# Patient Record
Sex: Female | Born: 1965 | Race: Black or African American | Hispanic: No | Marital: Married | Smoking: Never smoker
Health system: Southern US, Community
[De-identification: ages and names within clinical notes are randomized; demographics above are authoritative.]

## PROBLEM LIST (undated history)

## (undated) DIAGNOSIS — G4730 Sleep apnea, unspecified: Secondary | ICD-10-CM

## (undated) DIAGNOSIS — I10 Essential (primary) hypertension: Secondary | ICD-10-CM

## (undated) DIAGNOSIS — E785 Hyperlipidemia, unspecified: Secondary | ICD-10-CM

## (undated) DIAGNOSIS — R7303 Prediabetes: Secondary | ICD-10-CM

## (undated) DIAGNOSIS — K219 Gastro-esophageal reflux disease without esophagitis: Secondary | ICD-10-CM

## (undated) DIAGNOSIS — Z803 Family history of malignant neoplasm of breast: Secondary | ICD-10-CM

## (undated) HISTORY — DX: Family history of malignant neoplasm of breast: Z80.3

---

## 2006-12-13 ENCOUNTER — Emergency Department: Payer: Self-pay | Admitting: Emergency Medicine

## 2006-12-31 ENCOUNTER — Ambulatory Visit: Payer: Self-pay | Admitting: Surgery

## 2007-01-09 ENCOUNTER — Ambulatory Visit: Payer: Self-pay | Admitting: Surgery

## 2007-03-05 ENCOUNTER — Ambulatory Visit: Payer: Self-pay | Admitting: Family Medicine

## 2008-05-27 ENCOUNTER — Ambulatory Visit: Payer: Self-pay | Admitting: Family Medicine

## 2011-07-24 ENCOUNTER — Ambulatory Visit: Payer: Self-pay | Admitting: Family Medicine

## 2011-07-25 ENCOUNTER — Ambulatory Visit: Payer: Self-pay

## 2012-08-15 ENCOUNTER — Ambulatory Visit: Payer: Self-pay

## 2012-09-01 ENCOUNTER — Ambulatory Visit: Payer: Self-pay

## 2012-10-02 ENCOUNTER — Ambulatory Visit: Payer: Self-pay

## 2012-10-02 DIAGNOSIS — G4730 Sleep apnea, unspecified: Secondary | ICD-10-CM

## 2012-10-02 HISTORY — DX: Sleep apnea, unspecified: G47.30

## 2013-09-02 HISTORY — PX: CERVICAL POLYPECTOMY: SHX88

## 2013-09-07 ENCOUNTER — Ambulatory Visit: Payer: Self-pay | Admitting: Family Medicine

## 2013-09-13 ENCOUNTER — Ambulatory Visit: Payer: Self-pay | Admitting: Family Medicine

## 2013-09-27 ENCOUNTER — Ambulatory Visit: Payer: Self-pay | Admitting: Obstetrics and Gynecology

## 2013-09-27 LAB — CBC
HCT: 37.8 % (ref 35.0–47.0)
HGB: 11.6 g/dL — ABNORMAL LOW (ref 12.0–16.0)
MCH: 21.6 pg — ABNORMAL LOW (ref 26.0–34.0)
MCHC: 30.8 g/dL — ABNORMAL LOW (ref 32.0–36.0)
MCV: 70 fL — ABNORMAL LOW (ref 80–100)
PLATELETS: 226 10*3/uL (ref 150–440)
RBC: 5.4 10*6/uL — AB (ref 3.80–5.20)
RDW: 25.6 % — ABNORMAL HIGH (ref 11.5–14.5)
WBC: 4.5 10*3/uL (ref 3.6–11.0)

## 2013-09-27 LAB — BASIC METABOLIC PANEL
ANION GAP: 4 — AB (ref 7–16)
BUN: 17 mg/dL (ref 7–18)
CALCIUM: 9.7 mg/dL (ref 8.5–10.1)
CO2: 32 mmol/L (ref 21–32)
Chloride: 99 mmol/L (ref 98–107)
Creatinine: 0.83 mg/dL (ref 0.60–1.30)
EGFR (Non-African Amer.): >60
Glucose: 75 mg/dL (ref 65–99)
OSMOLALITY: 270 (ref 275–301)
POTASSIUM: 3 mmol/L — AB (ref 3.5–5.1)
Sodium: 135 mmol/L — ABNORMAL LOW (ref 136–145)

## 2013-10-07 ENCOUNTER — Ambulatory Visit: Payer: Self-pay | Admitting: Obstetrics and Gynecology

## 2013-10-09 LAB — PATHOLOGY REPORT

## 2014-04-04 ENCOUNTER — Ambulatory Visit: Payer: Self-pay | Admitting: Family Medicine

## 2014-10-05 ENCOUNTER — Ambulatory Visit: Payer: Self-pay | Admitting: Family Medicine

## 2014-12-24 NOTE — Op Note (Signed)
PATIENT NAME:  Jennifer Mueller, Jennifer Mueller MR#:  [id number] DATE OF BIRTH:  June 28, 1966  DATE OF PROCEDURE:  10/07/2013  PREOPERATIVE DIAGNOSIS:  Menorrhagia and endometrial polyp.   POSTOPERATIVE DIAGNOSIS:  Menorrhagia and endometrial polyp  PROCEDURES PERFORMED:  Hysteroscopy and dilatation and curettage.   ANESTHESIA USED:  General.   PRIMARY SURGEON:  Florina OuAndreas M. Bonney AidStaebler, M.D.   ESTIMATED BLOOD LOSS:  Minimal.  OPERATIVE FLUIDS:  600 mL of crystalloid.   URINE OUTPUT:  50 mL of clear urine.   PREOPERATIVE ANTIBIOTICS:  None.   DRAINS OR TUBES:  None.   IMPLANTS:  None.   COMPLICATIONS:  None.   FINDINGS:  Several small endometrial polyps. Normal cavity contour, normal tubal ostia and cervix.   SPECIMENS REMOVED:  Endometrial curettings.   THE PATIENT CONDITION FOLLOWING PROCEDURE:  Stable.   PROCEDURE IN DETAIL:  The risks, benefits and alternatives of the procedure were discussed with the patient prior to proceeding to the Operating Room. The patient was taken to the Operating Room where she was placed under general endotracheal anesthesia. She was positioned in the dorsal lithotomy position using Allen stirrups, prepped and draped in the usual sterile fashion. A time-out was performed. Attention was then turned to the patient's pelvis. The bladder drained using a red rubber catheter and an operative speculum was then placed to visualize the cervix. The anterior lip of the cervix was grasped with a single-tooth tenaculum and the cervix sequentially dilated using Pratt dilators. Following dilation of the cervix, hysteroscopy was performed noting the above findings. Following hysteroscopy, a sharp curettage was performed with a moderate amount of tissue returned with the curettage. A second look hysteroscopy revealed the previously documented polyp. The single-tooth tenaculum was removed and the tenaculum sites were noted to be hemostatic as was the cervix. The sponge, needle and instrument  counts were correct x 2. The patient tolerated the procedure well and was taken to the Recovery Room in stable condition.   ____________________________ Florina OuAndreas M. Bonney AidStaebler, MD ams:jm D: 10/07/2013 16:03:48 ET T: 10/07/2013 17:11:45 ET JOB#: [id number]  cc: Florina OuAndreas M. Bonney AidStaebler, MD, <Dictator> Carmel SacramentoANDREAS Cathrine MusterM Teleshia Lemere MD ELECTRONICALLY SIGNED 10/20/2013 0:59

## 2015-06-30 ENCOUNTER — Encounter: Payer: Self-pay | Admitting: Emergency Medicine

## 2015-06-30 ENCOUNTER — Emergency Department
Admission: EM | Admit: 2015-06-30 | Discharge: 2015-06-30 | Disposition: A | Discharge status: Home / Self Care | Payer: BC Managed Care – PPO | Attending: Emergency Medicine | Admitting: Emergency Medicine

## 2015-06-30 ENCOUNTER — Emergency Department: Payer: BC Managed Care – PPO

## 2015-06-30 DIAGNOSIS — H109 Unspecified conjunctivitis: Secondary | ICD-10-CM | POA: Insufficient documentation

## 2015-06-30 DIAGNOSIS — H5711 Ocular pain, right eye: Secondary | ICD-10-CM | POA: Diagnosis present

## 2015-06-30 DIAGNOSIS — L03213 Periorbital cellulitis: Secondary | ICD-10-CM

## 2015-06-30 DIAGNOSIS — H05011 Cellulitis of right orbit: Secondary | ICD-10-CM | POA: Diagnosis not present

## 2015-06-30 DIAGNOSIS — L989 Disorder of the skin and subcutaneous tissue, unspecified: Secondary | ICD-10-CM | POA: Insufficient documentation

## 2015-06-30 DIAGNOSIS — I10 Essential (primary) hypertension: Secondary | ICD-10-CM | POA: Insufficient documentation

## 2015-06-30 HISTORY — DX: Essential (primary) hypertension: I10

## 2015-06-30 LAB — CBC WITH DIFFERENTIAL/PLATELET
BASOS PCT: 0 %
Basophils Absolute: 0 10*3/uL (ref 0–0.1)
EOS ABS: 0.1 10*3/uL (ref 0–0.7)
EOS PCT: 1 %
HCT: 45.4 % (ref 35.0–47.0)
Hemoglobin: 15.2 g/dL (ref 12.0–16.0)
LYMPHS ABS: 1.2 10*3/uL (ref 1.0–3.6)
Lymphocytes Relative: 21 %
MCH: 26.2 pg (ref 26.0–34.0)
MCHC: 33.5 g/dL (ref 32.0–36.0)
MCV: 78.4 fL — ABNORMAL LOW (ref 80.0–100.0)
MONO ABS: 0.7 10*3/uL (ref 0.2–0.9)
MONOS PCT: 11 %
Neutro Abs: 4 10*3/uL (ref 1.4–6.5)
Neutrophils Relative %: 67 %
PLATELETS: 206 10*3/uL (ref 150–440)
RBC: 5.79 MIL/uL — ABNORMAL HIGH (ref 3.80–5.20)
RDW: 14.6 % — AB (ref 11.5–14.5)
WBC: 6 10*3/uL (ref 3.6–11.0)

## 2015-06-30 LAB — BASIC METABOLIC PANEL
Anion gap: 8 (ref 5–15)
BUN: 11 mg/dL (ref 6–20)
CALCIUM: 9.2 mg/dL (ref 8.9–10.3)
CHLORIDE: 99 mmol/L — AB (ref 101–111)
CO2: 27 mmol/L (ref 22–32)
CREATININE: 0.79 mg/dL (ref 0.44–1.00)
GFR calc Af Amer: >60 mL/min (ref >60)
GFR calc non Af Amer: >60 mL/min (ref >60)
Glucose, Bld: 111 mg/dL — ABNORMAL HIGH (ref 65–99)
Potassium: 3.6 mmol/L (ref 3.5–5.1)
SODIUM: 134 mmol/L — AB (ref 135–145)

## 2015-06-30 LAB — SEDIMENTATION RATE: SED RATE: 9 mm/h (ref 0–20)

## 2015-06-30 MED ORDER — HYDROMORPHONE HCL 1 MG/ML IJ SOLN
1.0000 mg | Freq: Once | INTRAMUSCULAR | Status: AC
  Administered 2015-06-30: 1 mg via INTRAVENOUS
  Filled 2015-06-30: qty 1

## 2015-06-30 MED ORDER — VALACYCLOVIR HCL 500 MG PO TABS: 1000.0000 mg | ORAL_TABLET | Freq: Three times a day (TID) | ORAL | Status: AC

## 2015-06-30 MED ORDER — IOHEXOL 300 MG/ML  SOLN
75.0000 mL | Freq: Once | INTRAMUSCULAR | Status: AC | PRN
Start: 2015-06-30 — End: 2015-06-30
  Administered 2015-06-30: 75 mL via INTRAVENOUS

## 2015-06-30 MED ORDER — FLUORESCEIN SODIUM 1 MG OP STRP
1.0000 | ORAL_STRIP | Freq: Once | OPHTHALMIC | Status: AC
  Administered 2015-06-30: 1 via OPHTHALMIC
  Filled 2015-06-30: qty 1

## 2015-06-30 MED ORDER — TETRACAINE HCL 0.5 % OP SOLN
2.0000 [drp] | Freq: Once | OPHTHALMIC | Status: AC
  Administered 2015-06-30: 2 [drp] via OPHTHALMIC
  Filled 2015-06-30: qty 2

## 2015-06-30 MED ORDER — SODIUM CHLORIDE 0.9 % IV BOLUS (SEPSIS)
1000.0000 mL | Freq: Once | INTRAVENOUS | Status: AC
  Administered 2015-06-30: 1000 mL via INTRAVENOUS
  Filled 2015-06-30: qty 1000

## 2015-06-30 MED ORDER — CLINDAMYCIN HCL 300 MG PO CAPS: 300.0000 mg | ORAL_CAPSULE | Freq: Three times a day (TID) | ORAL | Status: DC

## 2015-06-30 NOTE — ED Provider Notes (Signed)
[facility] Emergency Department Provider Note  ____________________________________________  Time seen: 6:10 AM  I have reviewed the triage vital signs and the nursing notes.   HISTORY  Chief Complaint Conjunctivitis    HPI Blossom Hoopsngela W Brazie is a 49 y.o. female who complains of right eye redness swelling and pain is been worsening over the past 4 days. She initially saw her doctor who thought that it was pinkeye and so recommended conservative treatment and did not start antibiotics. The symptoms continued and she developed a rash just to the right of the midline of the forehead and nose yesterday, so she saw her optometrist yesterday who prescribed topical ophthalmic antibiotics of erythromycin and tobramycin.Today she woke up with again worsening pain. No vision changes.     History reviewed. No pertinent past medical history. Hypertension  There are no active problems to display for this patient.    History reviewed. No pertinent past surgical history. Noncontributory  No current outpatient prescriptions on file.   Allergies Review of patient's allergies indicates no known allergies.   History reviewed. No pertinent family history.  Social History Social History  Substance Use Topics  . Smoking status: Never Smoker   . Smokeless tobacco: None  . Alcohol Use: No    Review of Systems  Constitutional:   No fever or chills. No weight changes Eyes:   No blurry vision or double vision. Right eye pain and redness and swelling as above ENT:   No sore throat. Cardiovascular:   No chest pain. Respiratory:   No dyspnea or cough. Gastrointestinal:   Negative for abdominal pain, vomiting and diarrhea.  No BRBPR or melena. Genitourinary:   Negative for dysuria, urinary retention, bloody urine, or difficulty urinating. Musculoskeletal:   Negative for back pain. No joint swelling or pain. Skin:   Negative for rash. Neurological:   Negative for  headaches, focal weakness or numbness. Psychiatric:  No anxiety or depression.   Endocrine:  No hot/cold intolerance, changes in energy, or sleep difficulty.  10-point ROS otherwise negative.  ____________________________________________   PHYSICAL EXAM:  VITAL SIGNS: ED Triage Vitals  Enc Vitals Group     BP 06/30/15 0554 183/114 mmHg     Pulse Rate 06/30/15 0554 92     Resp 06/30/15 0554 18     Temp 06/30/15 0554 98.2 F (36.8 C)     Temp Source 06/30/15 0554 Oral     SpO2 06/30/15 0554 97 %     Weight 06/30/15 0554 258 lb (117.028 kg)     Height 06/30/15 0554 5\' 5"  (1.651 m)     Head Cir --      Peak Flow --      Pain Score 06/30/15 0557 10     Pain Loc --      Pain Edu? --      Excl. in GC? --      Constitutional:   Alert and oriented. Well appearing and in no distress. Eyes:   No scleral icterus. No conjunctival pallor. PERRL. EOMI, but painful in the right eye with extraocular movements. Right eye conjunctivitis. ENT   Head:   Normocephalic and atraumatic. TMs and external canals normal. 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 There is one dendritic appearing ulceration in the skin of the right nasal bridge that is superficial. The eruptions are not open or weeping but do appear slightly  vesicular.   Nose:   No congestion/rhinnorhea. No septal hematoma   Mouth/Throat:   MMM, no pharyngeal erythema. No peritonsillar mass. No uvula shift.   Neck:   No stridor. No SubQ emphysema. No meningismus. Hematological/Lymphatic/Immunilogical:   No cervical lymphadenopathy. Cardiovascular:   RRR. Normal and symmetric distal pulses are present in all extremities. No murmurs, rubs, or gallops. Respiratory:   Normal respiratory effort without tachypnea nor retractions. Breath sounds are clear and equal bilaterally. No wheezes/rales/rhonchi. Gastrointestinal:   Soft and  nontender. No distention. There is no CVA tenderness.  No rebound, rigidity, or guarding. Genitourinary:   deferred Musculoskeletal:   Nontender with normal range of motion in all extremities. No joint effusions.  No lower extremity tenderness.  No edema. Neurologic:   Normal speech and language.  CN 2-10 normal. Motor grossly intact. No pronator drift.  Normal gait. No gross focal neurologic deficits are appreciated.  Skin:    Skin is warm, dry and intact. No rash noted.  No petechiae, purpura, or bullae. Psychiatric:   Mood and affect are normal. Speech and behavior are normal. Patient exhibits appropriate insight and judgment.  ____________________________________________    LABS (pertinent positives/negatives) (all labs ordered are listed, but only abnormal results are displayed) Labs Reviewed  BASIC METABOLIC PANEL  CBC WITH DIFFERENTIAL/PLATELET  SEDIMENTATION RATE   ____________________________________________   EKG    ____________________________________________    RADIOLOGY  CT orbits pending  ____________________________________________   PROCEDURES Slit lamp exam Examined with and without fluorescein stain with gaze directed in all directions No corneal ulcerations or abrasions noted.  No cell/flare.  Clear ant. Chamber.  Unremarkable OD exam.    ____________________________________________   INITIAL IMPRESSION / ASSESSMENT AND PLAN / ED COURSE  Pertinent labs & imaging results that were available during my care of the patient were reviewed by me and considered in my medical decision making (see chart for details).  Patient presents with worsening right eye symptoms and a facial rash. Main concerns are shingles with ophthalmic involvement versus orbital cellulitis. At the very least this is likely to be a periorbital cellulitis that requires oral antibiotics. We'll check labs and CT orbits, slit lamp  exam.  ----------------------------------------- 7:17 AM on 06/30/2015 -----------------------------------------  Slit lamp exam unremarkable, symptoms much improved after Dilaudid and topical tetracaine. Patient is artery on topical antibiotic steroid drops. His CT unremarkable we will treat preseptal cellulitis as well as acyclovir for possible zoster ophthalmicus and have her follow up with ophthalmology. The patient is signed out to oncoming physician Dr. Derrill Kay.   ____________________________________________   FINAL CLINICAL IMPRESSION(S) / ED DIAGNOSES  Final diagnoses:  None   right eye pain    Sharman Cheek, MD 06/30/15 [phone]

## 2015-06-30 NOTE — Discharge Instructions (Signed)
Please seek medical attention for any worsening eye pain, change in vision, inability to move your eye, high fevers, chest pain, shortness of breath, change in behavior, persistent vomiting, bloody stool or any other new or concerning symptoms.  Preseptal Cellulitis, Adult Preseptal cellulitis--also called periorbital cellulitis--is an infection that can affect your eyelid and the soft tissues or skin that surround your eye. The infection may also affect the structures that produce and drain your tears. It does not affect your eye itself. CAUSES This condition may be caused by:  Bacterial infection.  Long-term (chronic) sinus infections.  An object (foreign body) that is stuck behind the eye.  An injury that:  Goes through the eyelid tissues.  Causes an infection, such as an insect sting.  Fracture of the bone around the eye.  Infections that have spread from the eyelid or other structures around the eye.  Bite wounds.  Inflammation or infection of the lining membranes of the brain (meningitis).  An infection in the blood (septicemia).  Dental infection (abscess).  Viral infection. This is rare. RISK FACTORS Risk factors for preseptal cellulitis include:  Participating in activities that increase your risk of trauma to the face or head, such as boxing or high-speed activities.  Having a weakened defense system (immune system).  Medical conditions, such as nasal polyps, that increase your risk for frequent or recurrent sinus infections.  Not receiving regular dental care. SYMPTOMS Symptoms of this condition usually come on suddenly. Symptoms may include:  Red, hot, and swollen eyelids.  Fever.  Difficulty opening your eye.  Eye pain. DIAGNOSIS This condition may be diagnosed by an eye exam. You may also have tests, such as:  Blood tests.  CT scan.  MRI.  Spinal tap (lumbar puncture). This is a procedure that involves removing and examining a small amount of  the fluid that surrounds the brain and spinal cord. This checks for meningitis. TREATMENT Treatment for this condition will include antibiotic medicines. These may be given by mouth (orally), through an IV, or as a shot. Your health care provider may also recommend nasal decongestants to reduce swelling. HOME CARE INSTRUCTIONS  Take your antibiotic medicine as directed by your health care provider. Finish all of it even if you start to feel better.  Take medicines only as directed by your health care provider.  Drink enough fluid to keep your urine clear or pale yellow.  Do not use any tobacco products, including cigarettes, chewing tobacco, or electronic cigarettes. If you need help quitting, ask your health care provider.  Keep all follow-up visits as directed by your health care provider. These include any visits with an eye specialist (ophthalmologist) or dentist. SEEK MEDICAL CARE IF:  You have a fever.  Your eyelids become more red, warm, or swollen.  You have new symptoms.  Your symptoms do not get better with treatment. SEEK IMMEDIATE MEDICAL CARE IF:  You develop double vision, or your vision becomes blurred or worsens in any way.  You have trouble moving your eyes.  Your eye looks like it is sticking out or bulging out (proptosis).  You develop a severe headache, severe neck pain, or neck stiffness.  You develop repeated vomiting.   This information is not intended to replace advice given to you by your health care provider. Make sure you discuss any questions you have with your health care provider.   Document Released: 09/21/2010 Document Revised: 01/03/2015 Document Reviewed: 08/15/2014 Elsevier Interactive Patient Education [employer]2016 [employer] Inc.

## 2015-06-30 NOTE — ED Provider Notes (Signed)
-----------------------------------------   8:28 AM on 06/30/2015 -----------------------------------------  CT orbits back and consistent with preseptal cellulitis. Per Dr. Charmian MuffStafford's sign out will place on oral antibiotics and acyclovir. Will have patient follow up with ophthalmology.  Jennifer SemenGraydon Kiyanna Biegler, MD 06/30/15 [phone]

## 2015-06-30 NOTE — ED Notes (Signed)
Pt arrived to the ED for complaints of eye swelling and drainage x4 days. Pt is AOx4 in no apparent distress.

## 2015-07-14 ENCOUNTER — Other Ambulatory Visit: Payer: Self-pay | Admitting: Family Medicine

## 2015-07-14 DIAGNOSIS — Z1231 Encounter for screening mammogram for malignant neoplasm of breast: Secondary | ICD-10-CM

## 2015-10-10 ENCOUNTER — Ambulatory Visit
Admission: RE | Admit: 2015-10-10 | Discharge: 2015-10-10 | Disposition: A | Discharge status: Home / Self Care | Payer: BC Managed Care – PPO | Source: Ambulatory Visit | Attending: Family Medicine | Admitting: Family Medicine

## 2015-10-10 DIAGNOSIS — Z1231 Encounter for screening mammogram for malignant neoplasm of breast: Secondary | ICD-10-CM | POA: Insufficient documentation

## 2016-01-30 ENCOUNTER — Ambulatory Visit
Admission: EM | Admit: 2016-01-30 | Discharge: 2016-01-30 | Disposition: A | Discharge status: Home / Self Care | Payer: BC Managed Care – PPO | Attending: Internal Medicine | Admitting: Internal Medicine

## 2016-01-30 ENCOUNTER — Encounter: Payer: Self-pay | Admitting: Gynecology

## 2016-01-30 DIAGNOSIS — N9089 Other specified noninflammatory disorders of vulva and perineum: Secondary | ICD-10-CM

## 2016-01-30 HISTORY — DX: Hyperlipidemia, unspecified: E78.5

## 2016-01-30 HISTORY — DX: Gastro-esophageal reflux disease without esophagitis: K21.9

## 2016-01-30 MED ORDER — SULFAMETHOXAZOLE-TRIMETHOPRIM 800-160 MG PO TABS: 1.0000 | ORAL_TABLET | Freq: Two times a day (BID) | ORAL | Status: AC

## 2016-01-30 NOTE — Discharge Instructions (Signed)
Sore place could be a fever blister or a small boil.   Apply warm compresses for 5 minutes twice daily to promote drainage.  Apply antibiotic ointment after warm compresses, to keep surface of lesion soft and promote drainage. [facility] [place]   A swab was taken today for the cold sore virus; if this is positive we will let you know and send a prescription for a cold sore medicine. Recheck or followup with Dr Cornelius Moraswen, if not continuing to improve, after several days.

## 2016-01-30 NOTE — ED Notes (Signed)
Patient stated notice lump on vaginal region and itching x 6 days. Per patient no odor or discharge.

## 2016-02-01 LAB — HERPES SIMPLEX VIRUS(HSV) DNA BY PCR
HSV 1 DNA: NEGATIVE
HSV 2 DNA: NEGATIVE

## 2016-02-02 NOTE — ED Provider Notes (Signed)
CSN: [id number]     Arrival date & time 01/30/16  1613 History   First MD Initiated Contact with Patient 01/30/16 1739     Chief Complaint  Patient presents with  . Vaginal Itching   HPI  50 yo lady with 4-5d hx itchy/uncomfortable small lump R labial minora.  ?Draining.  Not enlarging, seems to be getting a little smaller.  No unusual vaginal discharge/bleeding, no dysuria.  Usually constipated, stools a little softer this week.  No abd pain.  No fever, no malaise.    Past Medical History  Diagnosis Date  . Hypertension   . Hyperlipemia   . GERD (gastroesophageal reflux disease)    Past Surgical History  Procedure Laterality Date  . Cervical polypectomy     Family History  Problem Relation Age of Onset  . Breast cancer Sister 2440  . Breast cancer Maternal Aunt 1058   Social History  Substance Use Topics  . Smoking status: Never Smoker   . Smokeless tobacco: None  . Alcohol Use: No    Review of Systems  All other systems reviewed and are negative.   Allergies  Review of patient's allergies indicates no known allergies.  Home Medications   Prior to Admission medications   Medication Sig Start Date End Date Taking? Authorizing Provider  amLODipine (NORVASC) 5 MG tablet Take 5 mg by mouth daily.   Yes Historical Provider, MD  Atorvastatin Calcium (LIPITOR PO) Take by mouth.   Yes Historical Provider, MD  levonorgestrel (MIRENA) 20 MCG/24HR IUD 1 each by Intrauterine route once.   Yes Historical Provider, MD  lisinopril-hydrochlorothiazide (PRINZIDE,ZESTORETIC) 20-12.5 MG tablet Take 1 tablet by mouth daily.   Yes Historical Provider, MD  metoprolol (LOPRESSOR) 50 MG tablet Take 100 mg by mouth every morning.   Yes Historical Provider, MD  potassium chloride (K-DUR) 10 MEQ tablet Take 10 mEq by mouth daily.   Yes Historical Provider, MD  clindamycin (CLEOCIN) 300 MG capsule Take 1 capsule (300 mg total) by mouth 3 (three) times daily. 06/30/15   Phineas SemenGraydon Goodman, MD   erythromycin ophthalmic ointment Place 1 application into the right eye at bedtime. For 10 days 06/29/15   Historical Provider, MD  naproxen sodium (ANAPROX) 220 MG tablet Take 440 mg by mouth 2 (two) times daily as needed (for pain).    Historical Provider, MD  tobramycin-dexamethasone [facility](TOBRADEX) ophthalmic solution Place 1 drop into the right eye 4 (four) times daily. For 7 days 06/29/15   Historical Provider, MD     BP 148/94 mmHg  Pulse 66  Temp(Src) 98.1 F (36.7 C) (Oral)  Resp 18  Ht 5\' 5"  (1.651 m)  Wt 265 lb (120.203 kg)  BMI 44.10 kg/m2  SpO2 99%  LMP 01/17/2016 Physical Exam  Constitutional: She is oriented to person, place, and time. No distress.  Alert, nicely groomed  HENT:  Head: Atraumatic.  Eyes:  Conjugate gaze, no eye redness/drainage  Neck: Neck supple.  Cardiovascular: Normal rate.   Pulmonary/Chest: No respiratory distress.  Abdominal: She exhibits no distension.  Genitourinary:  1cm pustular lesion pointing in 2 places, minimally tender, not deep, scant discharge expressible from site.  Otherwise vulva/perineum unremarkable, no other lesions.    Musculoskeletal: Normal range of motion.  No leg swelling  Neurological: She is alert and oriented to person, place, and time.  Skin: Skin is warm and dry.  No cyanosis  Nursing note and vitals reviewed.   ED Course  Procedures (including critical care time)  Labs Review  Labs Reviewed  HERPES SIMPLEX VIRUS(HSV) DNA BY PCR:  Pending (swab of lesion)     MDM   1. Labial lesion    Tiny abscess, spontaneously draining, staph/mixed organisms v HSV. Warm compresses, rx bactrim.  HSV swab pending. Recheck or [facility] if not continuing to improve over the next several days.    Meds ordered this encounter  Medications  . sulfamethoxazole-trimethoprim (BACTRIM DS,SEPTRA DS) 800-160 MG tablet    Sig: Take 1 tablet by mouth 2 (two) times daily.    Dispense:  14  tablet    Refill:  0     Eustace Moore, MD 02/02/16 2005

## 2016-02-04 ENCOUNTER — Telehealth: Payer: Self-pay | Admitting: *Deleted

## 2016-02-04 NOTE — ED Notes (Signed)
Patient returned phone call and was informed that her herpes lab results came back negative. Patient confirmed understanding of information.

## 2016-06-09 ENCOUNTER — Encounter: Payer: Self-pay | Admitting: Gynecology

## 2016-06-09 ENCOUNTER — Ambulatory Visit
Admission: EM | Admit: 2016-06-09 | Discharge: 2016-06-09 | Disposition: A | Discharge status: Home / Self Care | Payer: BC Managed Care – PPO | Attending: Family Medicine | Admitting: Family Medicine

## 2016-06-09 DIAGNOSIS — J019 Acute sinusitis, unspecified: Secondary | ICD-10-CM | POA: Diagnosis not present

## 2016-06-09 DIAGNOSIS — J069 Acute upper respiratory infection, unspecified: Secondary | ICD-10-CM | POA: Diagnosis not present

## 2016-06-09 MED ORDER — BENZONATATE 200 MG PO CAPS: 200.0000 mg | ORAL_CAPSULE | Freq: Three times a day (TID) | ORAL | 0 refills | Status: DC | PRN

## 2016-06-09 MED ORDER — FEXOFENADINE-PSEUDOEPHED ER 180-240 MG PO TB24: 1.0000 | ORAL_TABLET | Freq: Every day | ORAL | 0 refills | Status: DC

## 2016-06-09 MED ORDER — FLUTICASONE PROPIONATE 50 MCG/ACT NA SUSP: 2.0000 | Freq: Every day | NASAL | 0 refills | Status: DC

## 2016-06-09 MED ORDER — AMOXICILLIN-POT CLAVULANATE 875-125 MG PO TABS: 1.0000 | ORAL_TABLET | Freq: Two times a day (BID) | ORAL | 0 refills | Status: DC

## 2016-06-09 NOTE — ED Triage Notes (Addendum)
Patient c/o upper respiratory infection x 1 week.

## 2016-06-09 NOTE — ED Provider Notes (Signed)
[facility]    CSN: [id number] Arrival date & time: 06/09/16  1158     History   Chief Complaint Chief Complaint  Patient presents with  . URI    HPI Jennifer Mueller is a 50 y.o. female.   Patient's here because of nasal congestion and pressure. She states it started over a week ago. Started up the left upper nostril area and scattered progressively worse she is now coughing and feeling pressure on the left side of her face. She said developed a cough as well. Which blows his nose is thick and yellow.  She's had a history of sinus before and try to get in to see her ENT was unable to get an appointment. She's not allergic to anything she is has history of GERD hyperlipidemia and hypertension. Previous surgery cervical polypectomy. She has hypertension she does not smoke is a family history hypertension diabetes present as well as breast cancer in mother and maternal grandmother.   The history is provided by the patient. No language interpreter was used.  URI  Presenting symptoms: cough and rhinorrhea   Presenting symptoms: no ear pain   Severity:  Moderate Onset quality:  Sudden Duration:  7 days Timing:  Constant Progression:  Worsening Chronicity:  New Relieved by:  Nothing Worsened by:  Nothing Ineffective treatments:  Decongestant Associated symptoms: arthralgias, sinus pain and swollen glands   Associated symptoms: no neck pain and no wheezing     Past Medical History:  Diagnosis Date  . GERD (gastroesophageal reflux disease)   . Hyperlipemia   . Hypertension     There are no active problems to display for this patient.   Past Surgical History:  Procedure Laterality Date  . CERVICAL POLYPECTOMY      OB History    No data available       Home Medications    Prior to Admission medications   Medication Sig Start Date End Date Taking? Authorizing Provider  amLODipine (NORVASC) 5 MG tablet Take 5 mg by mouth daily.   Yes Historical  Provider, MD  Atorvastatin Calcium (LIPITOR PO) Take by mouth.   Yes Historical Provider, MD  clindamycin (CLEOCIN) 300 MG capsule Take 1 capsule (300 mg total) by mouth 3 (three) times daily. 06/30/15  Yes Phineas Semen, MD  erythromycin ophthalmic ointment Place 1 application into the right eye at bedtime. For 10 days 06/29/15  Yes Historical Provider, MD  levonorgestrel (MIRENA) 20 MCG/24HR IUD 1 each by Intrauterine route once.   Yes Historical Provider, MD  lisinopril-hydrochlorothiazide (PRINZIDE,ZESTORETIC) 20-12.5 MG tablet Take 1 tablet by mouth daily.   Yes Historical Provider, MD  metoprolol (LOPRESSOR) 50 MG tablet Take 100 mg by mouth every morning.   Yes Historical Provider, MD  naproxen sodium (ANAPROX) 220 MG tablet Take 440 mg by mouth 2 (two) times daily as needed (for pain).   Yes Historical Provider, MD  potassium chloride (K-DUR) 10 MEQ tablet Take 10 mEq by mouth daily.   Yes Historical Provider, MD  tobramycin-dexamethasone [facility]) ophthalmic solution Place 1 drop into the right eye 4 (four) times daily. For 7 days 06/29/15  Yes Historical Provider, MD  amoxicillin-clavulanate (AUGMENTIN) 875-125 MG tablet Take 1 tablet by mouth 2 (two) times daily. 06/09/16   Hassan Rowan, MD  benzonatate (TESSALON) 200 MG capsule Take 1 capsule (200 mg total) by mouth 3 (three) times daily as needed for cough. 06/09/16   Hassan Rowan, MD  fexofenadine-pseudoephedrine [facility]) 180-240 MG  24 hr tablet Take 1 tablet by mouth daily. 06/09/16   Hassan Rowan, MD  fluticasone (FLONASE) 50 MCG/ACT nasal spray Place 2 sprays into both nostrils daily. 06/09/16   Hassan Rowan, MD    Family History Family History  Problem Relation Age of Onset  . Breast cancer Sister 1  . Breast cancer Maternal Aunt 69    Social History Social History  Substance Use Topics  . Smoking status: Never Smoker  . Smokeless tobacco: Never Used  . Alcohol use No     Allergies   Review of  patient's allergies indicates no known allergies.   Review of Systems Review of Systems  HENT: Positive for postnasal drip, rhinorrhea and sinus pressure. Negative for ear pain.   Respiratory: Positive for cough. Negative for wheezing.   Musculoskeletal: Positive for arthralgias. Negative for neck pain.  All other systems reviewed and are negative.    Physical Exam Triage Vital Signs ED Triage Vitals  Enc Vitals Group     BP 06/09/16 1220 (!) 158/98     Pulse Rate 06/09/16 1220 88     Resp 06/09/16 1220 18     Temp 06/09/16 1220 98.4 F (36.9 C)     Temp Source 06/09/16 1220 Oral     SpO2 06/09/16 1220 98 %     Weight 06/09/16 1221 258 lb (117 kg)     Height 06/09/16 1221 5\' 5"  (1.651 m)     Head Circumference --      Peak Flow --      Pain Score 06/09/16 1223 3     Pain Loc --      Pain Edu? --      Excl. in GC? --    No data found.   Updated Vital Signs BP (!) 158/98 (BP Location: Left Arm)   Pulse 88   Temp 98.4 F (36.9 C) (Oral)   Resp 18   Ht 5\' 5"  (1.651 m)   Wt 258 lb (117 kg)   SpO2 98%   BMI 42.93 kg/m   Visual Acuity Right Eye Distance:   Left Eye Distance:   Bilateral Distance:    Right Eye Near:   Left Eye Near:    Bilateral Near:     Physical Exam  Constitutional: She appears well-developed and well-nourished.  HENT:  Head: Normocephalic and atraumatic.  Right Ear: Hearing, tympanic membrane, external ear and ear canal normal.  Left Ear: Hearing, tympanic membrane and ear canal normal.  Nose: Mucosal edema, rhinorrhea and sinus tenderness present. Left sinus exhibits maxillary sinus tenderness and frontal sinus tenderness.    Mouth/Throat: Uvula is midline and oropharynx is clear and moist.  Eyes: Pupils are equal, round, and reactive to light.  Neck: Normal range of motion.  Cardiovascular: Normal rate, regular rhythm and normal heart sounds.   Pulmonary/Chest: Effort normal.  Musculoskeletal: Normal range of motion. She exhibits no  edema.  Neurological: She is alert. She has normal reflexes.  Skin: Skin is warm and dry. No rash noted. No erythema.  Psychiatric: She has a normal mood and affect.  Vitals reviewed.    UC Treatments / Results  Labs (all labs ordered are listed, but only abnormal results are displayed) Labs Reviewed - No data to display  EKG  EKG Interpretation None       Radiology No results found.  Procedures Procedures (including critical care time)  Medications Ordered in UC Medications - No data to display   Initial Impression / Assessment  and Plan / UC Course  I have reviewed the triage vital signs and the nursing notes.  Pertinent labs & imaging results that were available during my care of the patient were reviewed by me and considered in my medical decision making (see chart for details).  Clinical Course    We'll going to place patient on Augmentin 875 one tablet twice a day Allegra-D 1 tablet daily and Flonase 2 spray 2 puffs each nostril on a daily basis. We will place on Tessalon Perles 200 mg cough up to 3 times a day on a when necessary basis and have follow-up in one week with PCP if the sinus condition does not improve  Note for work for tomorrow. Final Clinical Impressions(s) / UC Diagnoses   Final diagnoses:  Acute upper respiratory infection  Acute non-recurrent sinusitis, unspecified location    New Prescriptions New Prescriptions   AMOXICILLIN-CLAVULANATE (AUGMENTIN) 875-125 MG TABLET    Take 1 tablet by mouth 2 (two) times daily.   BENZONATATE (TESSALON) 200 MG CAPSULE    Take 1 capsule (200 mg total) by mouth 3 (three) times daily as needed for cough.   FEXOFENADINE-PSEUDOEPHEDRINE (ALLEGRA-D ALLERGY & CONGESTION) 180-240 MG 24 HR TABLET    Take 1 tablet by mouth daily.   FLUTICASONE (FLONASE) 50 MCG/ACT NASAL SPRAY    Place 2 sprays into both nostrils daily.     Hassan RowanEugene Vonn Sliger, MD 06/09/16 1253

## 2016-07-11 ENCOUNTER — Other Ambulatory Visit: Payer: Self-pay | Admitting: Family Medicine

## 2016-07-11 DIAGNOSIS — Z1239 Encounter for other screening for malignant neoplasm of breast: Secondary | ICD-10-CM

## 2016-11-06 ENCOUNTER — Ambulatory Visit
Admission: RE | Admit: 2016-11-06 | Discharge: 2016-11-06 | Disposition: A | Discharge status: Home / Self Care | Payer: BC Managed Care – PPO | Source: Ambulatory Visit | Attending: Family Medicine | Admitting: Family Medicine

## 2016-11-06 DIAGNOSIS — Z1231 Encounter for screening mammogram for malignant neoplasm of breast: Secondary | ICD-10-CM | POA: Diagnosis present

## 2016-11-06 DIAGNOSIS — Z1239 Encounter for other screening for malignant neoplasm of breast: Secondary | ICD-10-CM

## 2017-01-14 ENCOUNTER — Encounter: Payer: Self-pay | Admitting: *Deleted

## 2017-01-17 NOTE — Discharge Instructions (Signed)
Cataract Surgery, Care After °Refer to this sheet in the next few weeks. These instructions provide you with information about caring for yourself after your procedure. Your health care provider may also give you more specific instructions. Your treatment has been planned according to current medical practices, but problems sometimes occur. Call your health care provider if you have any problems or questions after your procedure. °What can I expect after the procedure? °After the procedure, it is common to have: °· Itching. °· Discomfort. °· Fluid discharge. °· Sensitivity to light and to touch. °· Bruising. °Follow these instructions at home: °Eye Care  °· Check your eye every day for signs of infection. Watch for: °¨ Redness, swelling, or pain. °¨ Fluid, blood, or pus. °¨ Warmth. °¨ Bad smell. °Activity  °· Avoid strenuous activities, such as playing contact sports, for as long as told by your health care provider. °· Do not drive or operate heavy machinery until your health care provider approves. °· Do not bend or lift heavy objects . Bending increases pressure in the eye. You can walk, climb stairs, and do light household chores. °· Ask your health care provider when you can return to work. If you work in a dusty environment, you may be advised to wear protective eyewear for a period of time. °General instructions  °· Take or apply over-the-counter and prescription medicines only as told by your health care provider. This includes eye drops. °· Do not touch or rub your eyes. °· If you were given a protective shield, wear it as told by your health care provider. If you were not given a protective shield, wear sunglasses as told by your health care provider to protect your eyes. °· Keep the area around your eye clean and dry. Avoid swimming or allowing water to hit you directly in the face while showering until told by your health care provider. Keep soap and shampoo out of your eyes. °· Do not put a contact lens  into the affected eye or eyes until your health care provider approves. °· Keep all follow-up visits as told by your health care provider. This is important. °Contact a health care provider if: ° °· You have increased bruising around your eye. °· You have pain that is not helped with medicine. °· You have a fever. °· You have redness, swelling, or pain in your eye. °· You have fluid, blood, or pus coming from your incision. °· Your vision gets worse. °Get help right away if: °· You have sudden vision loss. °This information is not intended to replace advice given to you by your health care provider. Make sure you discuss any questions you have with your health care provider. °Document Released: 03/08/2005 Document Revised: 12/28/2015 Document Reviewed: 06/29/2015 °Elsevier Interactive Patient Education © 2017 [employer] ° ° ° ° °General Anesthesia, Adult, Care After °These instructions provide you with information about caring for yourself after your procedure. Your health care provider may also give you more specific instructions. Your treatment has been planned according to current medical practices, but problems sometimes occur. Call your health care provider if you have any problems or questions after your procedure. °What can I expect after the procedure? °After the procedure, it is common to have: °· Vomiting. °· A sore throat. °· Mental slowness. °It is common to feel: °· Nauseous. °· Cold or shivery. °· Sleepy. °· Tired. °· Sore or achy, even in parts of your body where you did not have surgery. °Follow these instructions at   home: °For at least 24 hours after the procedure:  °· Do not: °¨ Participate in activities where you could fall or become injured. °¨ Drive. °¨ Use heavy machinery. °¨ Drink alcohol. °¨ Take sleeping pills or medicines that cause drowsiness. °¨ Make important decisions or sign legal documents. °¨ Take care of children on your own. °· Rest. °Eating and drinking  °· If you vomit, drink  water, juice, or soup when you can drink without vomiting. °· Drink enough fluid to keep your urine clear or pale yellow. °· Make sure you have little or no nausea before eating solid foods. °· Follow the diet recommended by your health care provider. °General instructions  °· Have a responsible adult stay with you until you are awake and alert. °· Return to your normal activities as told by your health care provider. Ask your health care provider what activities are safe for you. °· Take over-the-counter and prescription medicines only as told by your health care provider. °· If you smoke, do not smoke without supervision. °· Keep all follow-up visits as told by your health care provider. This is important. °Contact a health care provider if: °· You continue to have nausea or vomiting at home, and medicines are not helpful. °· You cannot drink fluids or start eating again. °· You cannot urinate after 8-12 hours. °· You develop a skin rash. °· You have fever. °· You have increasing redness at the site of your procedure. °Get help right away if: °· You have difficulty breathing. °· You have chest pain. °· You have unexpected bleeding. °· You feel that you are having a life-threatening or urgent problem. °This information is not intended to replace advice given to you by your health care provider. Make sure you discuss any questions you have with your health care provider. °Document Released: 11/25/2000 Document Revised: 01/22/2016 Document Reviewed: 08/03/2015 °Elsevier Interactive Patient Education © 2017 Elsevier Inc. ° °

## 2017-01-19 ENCOUNTER — Ambulatory Visit
Admission: EM | Admit: 2017-01-19 | Discharge: 2017-01-19 | Disposition: A | Discharge status: Home / Self Care | Payer: BC Managed Care – PPO | Attending: Family Medicine | Admitting: Family Medicine

## 2017-01-19 DIAGNOSIS — B9789 Other viral agents as the cause of diseases classified elsewhere: Secondary | ICD-10-CM | POA: Diagnosis not present

## 2017-01-19 DIAGNOSIS — J069 Acute upper respiratory infection, unspecified: Secondary | ICD-10-CM | POA: Diagnosis not present

## 2017-01-19 MED ORDER — LEVOCETIRIZINE DIHYDROCHLORIDE 5 MG PO TABS: 5.0000 mg | ORAL_TABLET | Freq: Every evening | ORAL | 0 refills | Status: DC

## 2017-01-19 MED ORDER — FLUTICASONE PROPIONATE 50 MCG/ACT NA SUSP: 2.0000 | Freq: Every day | NASAL | 0 refills | Status: AC

## 2017-01-19 NOTE — ED Triage Notes (Signed)
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  Nasal congestion.   Reports appt for eye surgery on Wednesday.   Has not taken BP med this morning.  167/114.

## 2017-01-19 NOTE — ED Provider Notes (Signed)
[facility]    CSN: [id number] Arrival date & time: 01/19/17  0901  History   Chief Complaint Chief Complaint  Patient presents with  . Nasal Congestion   HPI  51 year old female presents with the above complaint.  Patient states she's been sick for the past week. She's had sore throat, cough, hoarseness, postnasal drip, congestion. No associated fever. She's used several over-the-counter medications without improvement. No known exacerbating factors. 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 No other associated symptoms. No other complaints at this time.  Past Medical History:  Diagnosis Date  . GERD (gastroesophageal reflux disease)   . Hyperlipemia   . Hypertension   . Pre-diabetes   . Sleep apnea 10/02/2012   [facility] - Mild   Past Surgical History:  Procedure Laterality Date  . CERVICAL POLYPECTOMY     OB History    No data available     Home Medications    Prior to Admission medications   Medication Sig Start Date End Date Taking? Authorizing Provider  amLODipine (NORVASC) 5 MG tablet Take 5 mg by mouth daily.    [provider]  Difluprednate (DUREZOL) 0.05 % EMUL Apply to eye.    [provider]  fluticasone (FLONASE) 50 MCG/ACT nasal spray Place 2 sprays into both nostrils daily. 01/19/17   Tommie Sams, DO  levocetirizine (XYZAL) 5 MG tablet Take 1 tablet (5 mg total) by mouth every evening. 01/19/17   Tommie Sams, DO  levonorgestrel (MIRENA) 20 MCG/24HR IUD 1 each by Intrauterine route once.    [provider]  lisinopril-hydrochlorothiazide (PRINZIDE,ZESTORETIC) 20-12.5 MG tablet Take 1 tablet by mouth daily.    [provider]  metoprolol (LOPRESSOR) 50 MG tablet Take 100 mg by mouth every morning.    [provider]  naproxen sodium (ANAPROX) 220 MG tablet Take 440 mg by mouth 2 (two) times daily as needed (for pain).    [provider]  phentermine 15 MG capsule Take 15 mg  by mouth daily.    [provider]  potassium chloride (K-DUR) 10 MEQ tablet Take 10 mEq by mouth daily.    [provider]  pravastatin (PRAVACHOL) 40 MG tablet Take 40 mg by mouth daily.    [provider]    Family History Family History  Problem Relation Age of Onset  . Breast cancer Sister 12  . Breast cancer Maternal Aunt 52    Social History Social History  Substance Use Topics  . Smoking status: Never Smoker  . Smokeless tobacco: Never Used  . Alcohol use No     Comment: may have 1 glass wine/month     Allergies   Patient has no known allergies.   Review of Systems Review of Systems  Constitutional: Negative for fever.  HENT: Positive for congestion, postnasal drip and sore throat.   Respiratory: Positive for cough.    Physical Exam Triage Vital Signs ED Triage Vitals  Enc Vitals Group     BP 01/19/17 0919 (!) 167/114     Pulse Rate 01/19/17 0919 87     Resp 01/19/17 0919 16     Temp 01/19/17 0919 98.5 F (36.9 C)     Temp Source 01/19/17 0919 Oral     SpO2 01/19/17 0919 100 %     Weight 01/19/17 0920 241 lb (109.3 kg)     Height 01/19/17 0920 5\' 5"  (1.651 m)     Head Circumference --  Peak Flow --      Pain Score --      Pain Loc --      Pain Edu? --      Excl. in GC? --    Updated Vital Signs BP (!) 167/114 (BP Location: Right Arm)   Pulse 87   Temp 98.5 F (36.9 C) (Oral)   Resp 16   Ht 5\' 5"  (1.651 m)   Wt 241 lb (109.3 kg)   SpO2 100%   BMI 40.10 kg/m   Physical Exam  Constitutional: She is oriented to person, place, and time. She appears well-developed. No distress.  HENT:  Head: Normocephalic and atraumatic.  Mouth/Throat: Oropharynx is clear and moist.  Normal TMs bilateral.  Eyes: Conjunctivae are normal.  Neck: Neck supple.  Cardiovascular: Normal rate and regular rhythm.   Pulmonary/Chest: Effort normal and breath sounds normal.  Neurological: She is alert and oriented to person, place, and  time.  Psychiatric: She has a normal mood and affect.  Vitals reviewed.  UC Treatments / Results  Labs (all labs ordered are listed, but only abnormal results are displayed) Labs Reviewed - No data to display  EKG  EKG Interpretation None       Radiology No results found.  Procedures Procedures (including critical care time)  Medications Ordered in UC Medications - No data to display   Initial Impression / Assessment and Plan / UC Course  I have reviewed the triage vital signs and the nursing notes.  Pertinent labs & imaging results that were available during my care of the patient were reviewed by me and considered in my medical decision making (see chart for details).   51 year old female presents with signs and symptoms of an upper respiration infection. Possibly exacerbated by allergies as well. Treating with Flonase and Xyzal. Supportive care.  Final Clinical Impressions(s) / UC Diagnoses   Final diagnoses:  Viral upper respiratory tract infection    New Prescriptions Discharge Medication List as of 01/19/2017 10:22 AM    START taking these medications   Details  fluticasone (FLONASE) 50 MCG/ACT nasal spray Place 2 sprays into both nostrils daily., Starting Sun 01/19/2017, Normal    levocetirizine (XYZAL) 5 MG tablet Take 1 tablet (5 mg total) by mouth every evening., Starting Sun 01/19/2017, Normal         [place], [place]ageJayce G, DO 01/19/17 1029

## 2017-01-19 NOTE — ED Notes (Signed)
[facility]  [facility].  Reporting nasal congestion.  Voice understandable, non muffled.  No nasal drainage seen during triage or assessment.

## 2017-01-19 NOTE — ED Notes (Signed)
Pt in treatment room.  In NAD.  Needs address.  Pt/Fam updated on POC.   

## 2017-01-19 NOTE — Discharge Instructions (Signed)
Use the flonase and xyzal.  Supportive care  Take care  Dr. Adriana Simasook

## 2017-01-22 ENCOUNTER — Ambulatory Visit: Payer: BC Managed Care – PPO | Admitting: Anesthesiology

## 2017-01-22 ENCOUNTER — Ambulatory Visit
Admission: RE | Admit: 2017-01-22 | Discharge: 2017-01-22 | Disposition: A | Discharge status: Home / Self Care | Payer: BC Managed Care – PPO | Source: Ambulatory Visit | Attending: Ophthalmology | Admitting: Ophthalmology

## 2017-01-22 ENCOUNTER — Encounter
Admission: RE | Disposition: A | Discharge status: Home / Self Care | Payer: Self-pay | Source: Ambulatory Visit | Attending: Ophthalmology

## 2017-01-22 DIAGNOSIS — H2589 Other age-related cataract: Secondary | ICD-10-CM | POA: Diagnosis present

## 2017-01-22 DIAGNOSIS — K219 Gastro-esophageal reflux disease without esophagitis: Secondary | ICD-10-CM | POA: Insufficient documentation

## 2017-01-22 DIAGNOSIS — G4730 Sleep apnea, unspecified: Secondary | ICD-10-CM | POA: Insufficient documentation

## 2017-01-22 DIAGNOSIS — Z79899 Other long term (current) drug therapy: Secondary | ICD-10-CM | POA: Diagnosis not present

## 2017-01-22 DIAGNOSIS — I10 Essential (primary) hypertension: Secondary | ICD-10-CM | POA: Insufficient documentation

## 2017-01-22 HISTORY — PX: CATARACT EXTRACTION W/PHACO: SHX586

## 2017-01-22 HISTORY — DX: Prediabetes: R73.03

## 2017-01-22 HISTORY — DX: Sleep apnea, unspecified: G47.30

## 2017-01-22 SURGERY — PHACOEMULSIFICATION, CATARACT, WITH IOL INSERTION
Anesthesia: Monitor Anesthesia Care | Site: Eye | Laterality: Right | Wound class: Clean

## 2017-01-22 MED ORDER — FENTANYL CITRATE (PF) 100 MCG/2ML IJ SOLN
INTRAMUSCULAR | Status: DC | PRN
  Administered 2017-01-22: 100 ug via INTRAVENOUS

## 2017-01-22 MED ORDER — EPINEPHRINE PF 1 MG/ML IJ SOLN
INTRAMUSCULAR | Status: DC | PRN
  Administered 2017-01-22: 53 mL via OPHTHALMIC

## 2017-01-22 MED ORDER — ARMC OPHTHALMIC DILATING DROPS
1.0000 "application " | OPHTHALMIC | Status: DC | PRN
  Administered 2017-01-22 (×2): 1 via OPHTHALMIC

## 2017-01-22 MED ORDER — TRYPAN BLUE 0.06 % OP SOLN
OPHTHALMIC | Status: DC | PRN
  Administered 2017-01-22: 0.5 mL via INTRAOCULAR

## 2017-01-22 MED ORDER — LIDOCAINE HCL (PF) 2 % IJ SOLN
INTRAOCULAR | Status: DC | PRN
  Administered 2017-01-22: 1 mL via INTRAOCULAR

## 2017-01-22 MED ORDER — CEFUROXIME OPHTHALMIC INJECTION 1 MG/0.1 ML
INJECTION | OPHTHALMIC | Status: DC | PRN
  Administered 2017-01-22: 0.1 mL via OPHTHALMIC

## 2017-01-22 MED ORDER — NA HYALUR & NA CHOND-NA HYALUR 0.4-0.35 ML IO KIT
PACK | INTRAOCULAR | Status: DC | PRN
  Administered 2017-01-22: 1 mL via INTRAOCULAR

## 2017-01-22 MED ORDER — BRIMONIDINE TARTRATE-TIMOLOL 0.2-0.5 % OP SOLN
OPHTHALMIC | Status: DC | PRN
  Administered 2017-01-22: 1 [drp] via OPHTHALMIC

## 2017-01-22 MED ORDER — MOXIFLOXACIN HCL 0.5 % OP SOLN
1.0000 [drp] | OPHTHALMIC | Status: DC | PRN
  Administered 2017-01-22 (×2): 1 [drp] via OPHTHALMIC

## 2017-01-22 MED ORDER — MIDAZOLAM HCL 2 MG/2ML IJ SOLN
INTRAMUSCULAR | Status: DC | PRN
  Administered 2017-01-22: 2 mg via INTRAVENOUS

## 2017-01-22 MED ORDER — SODIUM HYALURONATE 23 MG/ML IO SOLN
INTRAOCULAR | Status: DC | PRN
  Administered 2017-01-22: 0.6 mL via INTRAOCULAR

## 2017-01-22 SURGICAL SUPPLY — 28 items
CANNULA ANT/CHMB 27G (MISCELLANEOUS) ×1 IMPLANT
CANNULA ANT/CHMB 27GA (MISCELLANEOUS) ×3 IMPLANT
CARTRIDGE ABBOTT (MISCELLANEOUS) IMPLANT
GLOVE SURG LX 7.5 STRW (GLOVE) ×4
GLOVE SURG LX STRL 7.5 STRW (GLOVE) ×1 IMPLANT
GLOVE SURG TRIUMPH 8.0 PF LTX (GLOVE) ×3 IMPLANT
GOWN STRL REUS W/ TWL LRG LVL3 (GOWN DISPOSABLE) ×2 IMPLANT
GOWN STRL REUS W/TWL LRG LVL3 (GOWN DISPOSABLE) ×6
LENS IOL TECNIS ITEC 21.0 (Intraocular Lens) ×2 IMPLANT
MARKER SKIN DUAL TIP RULER LAB (MISCELLANEOUS) ×3 IMPLANT
NDL FILTER BLUNT 18X1 1/2 (NEEDLE) ×1 IMPLANT
NDL RETROBULBAR .5 NSTRL (NEEDLE) IMPLANT
NEEDLE FILTER BLUNT 18X 1/2SAF (NEEDLE) ×2
NEEDLE FILTER BLUNT 18X1 1/2 (NEEDLE) ×1 IMPLANT
NEEDLE HYPO 22GX1.5 SAFETY (NEEDLE) ×2 IMPLANT
PACK CATARACT BRASINGTON (MISCELLANEOUS) ×3 IMPLANT
PACK EYE AFTER SURG (MISCELLANEOUS) ×3 IMPLANT
PACK OPTHALMIC (MISCELLANEOUS) ×3 IMPLANT
RING MALYGIN 7.0 (MISCELLANEOUS) IMPLANT
SUT ETHILON 10-0 CS-B-6CS-B-6 (SUTURE)
SUT VICRYL  9 0 (SUTURE)
SUT VICRYL 9 0 (SUTURE) IMPLANT
SUTURE EHLN 10-0 CS-B-6CS-B-6 (SUTURE) IMPLANT
SYR 3ML LL SCALE MARK (SYRINGE) ×5 IMPLANT
SYR 5ML LL (SYRINGE) ×3 IMPLANT
SYR TB 1ML LUER SLIP (SYRINGE) ×3 IMPLANT
WATER STERILE IRR 250ML POUR (IV SOLUTION) ×3 IMPLANT
WIPE NON LINTING 3.25X3.25 (MISCELLANEOUS) ×3 IMPLANT

## 2017-01-22 NOTE — H&P (Signed)
The History and Physical notes are on paper, have been signed, and are to be scanned. The patient remains stable and unchanged from the H&P.   Previous H&P reviewed, patient examined, and there are no changes.  Jennifer Mueller 01/22/2017 11:39 AM

## 2017-01-22 NOTE — Anesthesia Preprocedure Evaluation (Signed)
Anesthesia Evaluation  Patient identified by MRN, date of birth, ID band Patient awake    Reviewed: Allergy & Precautions, NPO status , Patient's Chart, lab work & pertinent test results  Airway Mallampati: II  TM Distance: >3 FB Neck ROM: Full    Dental no notable dental hx.    Pulmonary sleep apnea ,    Pulmonary exam normal        Cardiovascular hypertension, Pt. on medications and Pt. on home beta blockers Normal cardiovascular exam     Neuro/Psych    GI/Hepatic Neg liver ROS, GERD  ,  Endo/Other  negative endocrine ROS  Renal/GU negative Renal ROS     Musculoskeletal   Abdominal   Peds  Hematology negative hematology ROS (+)   Anesthesia Other Findings   Reproductive/Obstetrics                             Anesthesia Physical Anesthesia Plan  ASA: II  Anesthesia Plan: MAC   Post-op Pain Management:    Induction: Intravenous  Airway Management Planned:   Additional Equipment:   Intra-op Plan:   Post-operative Plan:   Informed Consent: I have reviewed the patients History and Physical, chart, labs and discussed the procedure including the risks, benefits and alternatives for the proposed anesthesia with the patient or authorized representative who has indicated his/her understanding and acceptance.     Plan Discussed with: CRNA  Anesthesia Plan Comments:         Anesthesia Quick Evaluation

## 2017-01-22 NOTE — Op Note (Signed)
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  Vision Blue dye was used to stain the lens capsule.  LENS:   Implant Name Type Inv. Item Serial No. Manufacturer Lot No. LRB No. Used  LENS IOL DIOP 21.0 - [id number] Intraocular Lens LENS IOL DIOP 21.0 [id number] AMO   Right 1       ULTRASOUND TIME: 5 of 0 minutes 19 seconds, CDE 1.0  SURGEON:  Deirdre Evenerhadwick R. Jorgen Wolfinger, MD   ANESTHESIA:  Topical with tetracaine drops and 2% Xylocaine jelly, augmented with 1% preservative-free intracameral lidocaine.   COMPLICATIONS:  None.   DESCRIPTION OF PROCEDURE:  The patient was identified in the holding room and transported to the operating room and placed in the supine position under the operating microscope. Theright eye was identified as the operative eye and it was prepped and draped in the usual sterile ophthalmic fashion.  A 1 millimeter clear-corneal paracentesis was made at the 12:00 position.  0.5 ml of preservative-free 1% lidocaine was injected into the anterior chamber. The anterior chamber was filled with Healon 5 viscoelastic.  A 2.4 millimeter keratome was used to make a near-clear corneal incision at the 9:00 position.  The anterior chamber was filled with Healon 5 viscoelastic.  Vision Blue dye was then injected under the viscoelastic to stain the lens capsule.  BSS was then used to wash the dye out.  Additional Healon 5 was placed into the anterior chamber.  A curvilinear capsulorrhexis was made with a cystotome and capsulorrhexis forceps.  Balanced salt solution was used to hydrodissect and hydrodelineate the nucleus.  Viscoat was then placed in the anterior chamber.   Phacoemulsification was then used in stop and chop fashion  to remove the lens nucleus and epinucleus.  The remaining cortex was then removed using the irrigation and aspiration handpiece. Provisc was then placed into the capsular bag to distend it for lens placement.  A 21.0 -diopter lens was then injected into the capsular bag.  The remaining viscoelastic was aspirated.   Wounds were hydrated with balanced salt solution.  The anterior chamber was inflated to a physiologic pressure with balanced salt solution. Cefuroxime 0.1 ml of a 10mg /ml solution was injected into the anterior chamber for a dose of 1 mg of intracameral antibiotic at the completion of the case. Miostat was placed into the anterior chamber to constrict the pupil.  No wound leaks were noted.  Topical Vigamox drops and Maxitrol ointment were applied to the eye.  The patient was taken to the recovery room in stable condition without complications of anesthesia or surgery.  Othel Dicostanzo 01/22/2017, 12:33 PM

## 2017-01-22 NOTE — Anesthesia Postprocedure Evaluation (Signed)
Anesthesia Post Note  Patient: Jennifer Mueller  Procedure(s) Performed: Procedure(s) (LRB): CATARACT EXTRACTION PHACO AND INTRAOCULAR LENS PLACEMENT (IOC)  Right Complicated (Right)  Patient location during evaluation: PACU Anesthesia Type: MAC Level of consciousness: awake and alert and oriented Pain management: pain level controlled Vital Signs Assessment: post-procedure vital signs reviewed and stable Respiratory status: spontaneous breathing and nonlabored ventilation Cardiovascular status: stable Postop Assessment: no signs of nausea or vomiting and adequate PO intake Anesthetic complications: no    Estill Batten

## 2017-01-22 NOTE — Transfer of Care (Signed)
Immediate Anesthesia Transfer of Care Note  Patient: Jennifer Mueller  Procedure(s) Performed: Procedure(s) with comments: CATARACT EXTRACTION PHACO AND INTRAOCULAR LENS PLACEMENT (IOC)  Right Complicated (Right) -  healon 5 vision blue  Patient Location: PACU  Anesthesia Type: MAC  Level of Consciousness: awake, alert  and patient cooperative  Airway and Oxygen Therapy: Patient Spontanous Breathing and Patient connected to supplemental oxygen  Post-op Assessment: Post-op Vital signs reviewed, Patient's Cardiovascular Status Stable, Respiratory Function Stable, Patent Airway and No signs of Nausea or vomiting  Post-op Vital Signs: Reviewed and stable  Complications: No apparent anesthesia complications

## 2017-01-22 NOTE — Anesthesia Procedure Notes (Signed)
Procedure Name: MAC Performed by: Mayme Genta Pre-anesthesia Checklist: Patient identified, Emergency Drugs available, Suction available, Timeout performed and Patient being monitored Patient Re-evaluated:Patient Re-evaluated prior to inductionOxygen Delivery Method: Nasal cannula Placement Confirmation: positive ETCO2

## 2017-03-28 ENCOUNTER — Encounter: Payer: Self-pay | Admitting: Obstetrics and Gynecology

## 2017-03-28 ENCOUNTER — Ambulatory Visit (INDEPENDENT_AMBULATORY_CARE_PROVIDER_SITE_OTHER): Payer: BC Managed Care – PPO | Admitting: Obstetrics and Gynecology

## 2017-03-28 VITALS — BP 132/90 | HR 69 | Ht 65.0 in | Wt 231.0 lb

## 2017-03-28 DIAGNOSIS — Z1211 Encounter for screening for malignant neoplasm of colon: Secondary | ICD-10-CM | POA: Diagnosis not present

## 2017-03-28 DIAGNOSIS — Z1239 Encounter for other screening for malignant neoplasm of breast: Secondary | ICD-10-CM

## 2017-03-28 DIAGNOSIS — Z01419 Encounter for gynecological examination (general) (routine) without abnormal findings: Secondary | ICD-10-CM

## 2017-03-28 DIAGNOSIS — N939 Abnormal uterine and vaginal bleeding, unspecified: Secondary | ICD-10-CM | POA: Diagnosis not present

## 2017-03-28 DIAGNOSIS — Z1231 Encounter for screening mammogram for malignant neoplasm of breast: Secondary | ICD-10-CM | POA: Diagnosis not present

## 2017-03-28 MED ORDER — GABAPENTIN 100 MG PO CAPS: 100.0000 mg | ORAL_CAPSULE | Freq: Every day | ORAL | 11 refills | Status: DC

## 2017-03-28 NOTE — Patient Instructions (Signed)
Preventive Care 40-64 Years, Female Preventive care refers to lifestyle choices and visits with your health care provider that can promote health and wellness. What does preventive care include?  A yearly physical exam. This is also called an annual well check.  Dental exams once or twice a year.  Routine eye exams. Ask your health care provider how often you should have your eyes checked.  Personal lifestyle choices, including: ? Daily care of your teeth and gums. ? Regular physical activity. ? Eating a healthy diet. ? Avoiding tobacco and drug use. ? Limiting alcohol use. ? Practicing safe sex. ? Taking low-dose aspirin daily starting at age 50. ? Taking vitamin and mineral supplements as recommended by your health care provider. What happens during an annual well check? The services and screenings done by your health care provider during your annual well check will depend on your age, overall health, lifestyle risk factors, and family history of disease. Counseling Your health care provider may ask you questions about your:  Alcohol use.  Tobacco use.  Drug use.  Emotional well-being.  Home and relationship well-being.  Sexual activity.  Eating habits.  Work and work Statistician.  Method of birth control.  Menstrual cycle.  Pregnancy history.  Screening You may have the following tests or measurements:  Height, weight, and BMI.  Blood pressure.  Lipid and cholesterol levels. These may be checked every 5 years, or more frequently if you are over 50 years old.  Skin check.  Lung cancer screening. You may have this screening every year starting at age 50 if you have a 30-pack-year history of smoking and currently smoke or have quit within the past 15 years.  Fecal occult blood test (FOBT) of the stool. You may have this test every year starting at age 50.  Flexible sigmoidoscopy or colonoscopy. You may have a sigmoidoscopy every 5 years or a colonoscopy  every 10 years starting at age 50.  Hepatitis C blood test.  Hepatitis B blood test.  Sexually transmitted disease (STD) testing.  Diabetes screening. This is done by checking your blood sugar (glucose) after you have not eaten for a while (fasting). You may have this done every 1-3 years.  Mammogram. This may be done every 1-2 years. Talk to your health care provider about when you should start having regular mammograms. This may depend on whether you have a family history of breast cancer.  BRCA-related cancer screening. This may be done if you have a family history of breast, ovarian, tubal, or peritoneal cancers.  Pelvic exam and Pap test. This may be done every 3 years starting at age 50. Starting at age 50, this may be done every 5 years if you have a Pap test in combination with an HPV test.  Bone density scan. This is done to screen for osteoporosis. You may have this scan if you are at high risk for osteoporosis.  Discuss your test results, treatment options, and if necessary, the need for more tests with your health care provider. Vaccines Your health care provider may recommend certain vaccines, such as:  Influenza vaccine. This is recommended every year.  Tetanus, diphtheria, and acellular pertussis (Tdap, Td) vaccine. You may need a Td booster every 10 years.  Varicella vaccine. You may need this if you have not been vaccinated.  Zoster vaccine. You may need this after age 50.  Measles, mumps, and rubella (MMR) vaccine. You may need at least one dose of MMR if you were born in  1956 or later. You may also need a second dose.  Pneumococcal 13-valent conjugate (PCV13) vaccine. You may need this if you have certain conditions and were not previously vaccinated.  Pneumococcal polysaccharide (PPSV23) vaccine. You may need one or two doses if you smoke cigarettes or if you have certain conditions.  Meningococcal vaccine. You may need this if you have certain  conditions.  Hepatitis A vaccine. You may need this if you have certain conditions or if you travel or work in places where you may be exposed to hepatitis A.  Hepatitis B vaccine. You may need this if you have certain conditions or if you travel or work in places where you may be exposed to hepatitis B.  Haemophilus influenzae type b (Hib) vaccine. You may need this if you have certain conditions.  Talk to your health care provider about which screenings and vaccines you need and how often you need them. This information is not intended to replace advice given to you by your health care provider. Make sure you discuss any questions you have with your health care provider. Document Released: 09/15/2015 Document Revised: 05/08/2016 Document Reviewed: 06/20/2015 Elsevier Interactive Patient Education  2017 [employer].

## 2017-03-28 NOTE — Progress Notes (Signed)
Patient ID: STEPHANIEANN POPESCU, female   DOB: [birth year], 51 y.o.   MRN: [id number]     Gynecology Annual Exam  PCP: [facility]  Chief Complaint:  Chief Complaint  Patient presents with  . Gynecologic Exam    bleeding w/IUD    History of Present Illness:Patient is a 51 y.o. No obstetric history on file. presents for annual exam. The patient has no complaints today.   LMP: No LMP recorded. Patient is not currently having periods (Reason: IUD). She has had some spotting over the past 2 weeks though.  Has noted some increased vasomotor symptoms of moderate severity.  The patient is sexually active. She denies dyspareunia.  The patient does perform self breast exams.  There is no notable family history of breast or ovarian cancer in her family.  The patient wears seatbelts: yes.   The patient has regular exercise: not asked.    The patient denies current symptoms of depression.     Review of Systems: Review of Systems  Constitutional: Positive for diaphoresis. Negative for chills and fever.  HENT: Negative for congestion.   Eyes: Positive for pain.  Respiratory: Negative for cough and shortness of breath.   Cardiovascular: Negative for chest pain and palpitations.  Gastrointestinal: Negative for abdominal pain, constipation, diarrhea, heartburn, nausea and vomiting.  Genitourinary: Negative for dysuria, frequency and urgency.  Skin: Negative for itching and rash.  Neurological: Negative for dizziness and headaches.  Endo/Heme/Allergies: Negative for polydipsia.  Psychiatric/Behavioral: Negative for depression.    Past Medical History:  Past Medical History:  Diagnosis Date  . GERD (gastroesophageal reflux disease)   . Hyperlipemia   . Hypertension   . Pre-diabetes   . Sleep apnea 10/02/2012   [facility] - Mild    Past Surgical History:  Past Surgical History:  Procedure Laterality Date  . CATARACT EXTRACTION W/PHACO Right 01/22/2017   Procedure:  CATARACT EXTRACTION PHACO AND INTRAOCULAR LENS PLACEMENT (IOC)  Right Complicated;  Surgeon: Lockie Mola, MD;  Location: [facility];  Service: Ophthalmology;  Laterality: Right;   healon 5 vision blue  . CERVICAL POLYPECTOMY  2015    Gynecologic History:  No LMP recorded. Patient is not currently having periods (Reason: IUD). Last Pap: Results were: 03/12/16 NIL and HR HPV negative  Last mammogram: 11/06/16 Results were: BI-RAD I Obstetric History: No obstetric history on file.  Family History:  Family History  Problem Relation Age of Onset  . Breast cancer Sister 10  . Breast cancer Maternal Aunt 75    Social History:  Social History   Social History  . Marital status: Single    Spouse name: N/A  . Number of children: N/A  . Years of education: N/A   Occupational History  . Not on file.   Social History Main Topics  . Smoking status: Never Smoker  . Smokeless tobacco: Never Used  . Alcohol use No     Comment: may have 1 glass wine/month  . Drug use: No  . Sexual activity: Yes   Other Topics Concern  . Not on file   Social History Narrative  . No narrative on file    Allergies:  No Known Allergies  Medications: Prior to Admission medications   Medication Sig Start Date End Date Taking? Authorizing Provider  amLODipine (NORVASC) 5 MG tablet Take 5 mg by mouth daily.   Yes [provider]  fluticasone (FLONASE) 50 MCG/ACT nasal spray Place 2 sprays into both nostrils daily. 01/19/17  Yes Cook, Jayce G, DO  levocetirizine (XYZAL) 5 MG tablet Take 1 tablet (5 mg total) by mouth every evening. 01/19/17  Yes Tommie Samsook, Jayce G, DO  levonorgestrel (MIRENA) 20 MCG/24HR IUD 1 each by Intrauterine route once.   Yes [provider]  lisinopril-hydrochlorothiazide (PRINZIDE,ZESTORETIC) 20-12.5 MG tablet Take 1 tablet by mouth daily.   Yes [provider]  metoprolol (LOPRESSOR) 50 MG tablet Take 100 mg by mouth every morning.   Yes  [provider]  phentermine 15 MG capsule Take 15 mg by mouth daily.   Yes [provider]  potassium chloride (K-DUR) 10 MEQ tablet Take 10 mEq by mouth daily.   Yes [provider]  pravastatin (PRAVACHOL) 40 MG tablet Take 40 mg by mouth daily.   Yes [provider]  Topiramate (TOPAMAX PO) Take by mouth.   Yes [provider]    Physical Exam Vitals: Blood pressure 132/90, pulse 69, height 5\' 5"  (1.651 m), weight 231 lb (104.8 kg).  General: NAD HEENT: normocephalic, anicteric Thyroid: no enlargement, no palpable nodules Pulmonary: No increased work of breathing, CTAB Cardiovascular: RRR, distal pulses 2+ Breast: Breast symmetrical, no tenderness, no palpable nodules or masses, no skin or nipple retraction present, no nipple discharge.  No axillary or supraclavicular lymphadenopathy. Abdomen: NABS, soft, non-tender, non-distended.  Umbilicus without lesions.  No hepatomegaly, splenomegaly or masses palpable. No evidence of hernia  Genitourinary:  External: Normal external female genitalia.  Normal urethral meatus, normal Bartholin's and Skene's glands.    Vagina: Normal vaginal mucosa, no evidence of prolapse.    Cervix: Grossly normal in appearance, mild bleeding, IUD strings seen  Uterus: Non-enlarged, mobile, normal contour.  No CMT  Adnexa: ovaries non-enlarged, no adnexal masses  Rectal: deferred  Lymphatic: no evidence of inguinal lymphadenopathy Extremities: no edema, erythema, or tenderness Neurologic: Grossly intact Psychiatric: mood appropriate, affect full  Female chaperone present for pelvic and breast  portions of the physical exam    Assessment: 51 y.o. routine annual exam  Plan: Problem List Items Addressed This Visit    None    Visit Diagnoses    Abnormal uterine bleeding    -  Primary   Relevant Orders   US Transvaginal Non-OB   Breast screening       Relevant Orders   MM DIGITAL SCREENING BILATERAL    Special screening for malignant neoplasms, colon       Relevant Orders   Ambulatory referral to Gastroenterology   Encounter for gynecological examination without abnormal finding          1) Mammogram - recommend yearly screening mammogram.  Mammogram Is up to date  2) STI screening was not offered  3) ASCCP guidelines and rational discussed.  Patient opts for every 3 years screening interval - Next 2020  4) Osteoporosis  - per USPTF routine screening DEXA at age 50  5) Routine healthcare maintenance including cholesterol, diabetes screening discussed managed by PCP  6) Colonoscopy Screening recommended starting at age 50 for average risk individuals, age 50 for individuals deemed at increased risk (including African Americans) and recommended to continue until age 50.  For patient age 50-85 individualized approach is recommended.  Gold standard screening is via colonoscopy, Cologuard screening is an acceptable alternative for patient unwilling or unable to undergo colonoscopy.  "Colorectal cancer screening for average?risk adults: 2018 guideline update from the American Cancer Society"CA: A Cancer Journal for Clinicians: Jan 29, 2017  - ordered today  7) Vasomotor symptoms -  start trial of gabapenti  8) US evaluate IUD - prolonged bleeding pattern may be excessive thinning if postmenopausal now, may remove IUD depending on ultrasound findings  9) Follow up 1 year for routine annual, follow up 1-2 weeks TVUS

## 2017-04-14 ENCOUNTER — Other Ambulatory Visit: Payer: Self-pay

## 2017-04-14 ENCOUNTER — Telehealth: Payer: Self-pay

## 2017-04-14 DIAGNOSIS — Z1211 Encounter for screening for malignant neoplasm of colon: Secondary | ICD-10-CM

## 2017-04-14 MED ORDER — NA SULFATE-K SULFATE-MG SULF 17.5-3.13-1.6 GM/177ML PO SOLN: 1.0000 | Freq: Once | ORAL | 0 refills | Status: AC

## 2017-04-14 NOTE — Telephone Encounter (Signed)
Gastroenterology Pre-Procedure Review  Request Date: 10/29 Requesting Physician: Dr. Servando SnareWohl  PATIENT REVIEW QUESTIONS: The patient responded to the following health history questions as indicated:    1. Are you having any GI issues? no 2. Do you have a personal history of Polyps? no 3. Do you have a family history of Colon Cancer or Polyps? no 4. Diabetes Mellitus? yes (type 2) 5. Joint replacements in the past 12 months?no 6. Major health problems in the past 3 months?no 7. Any artificial heart valves, MVP, or defibrillator?no    MEDICATIONS & ALLERGIES:    Patient reports the following regarding taking any anticoagulation/antiplatelet therapy:   Plavix, Coumadin, Eliquis, Xarelto, Lovenox, Pradaxa, Brilinta, or Effient? no Aspirin? no  Patient confirms/reports the following medications:  Current Outpatient Prescriptions  Medication Sig Dispense Refill  . clindamycin (CLEOCIN) 300 MG capsule Take by mouth.    . erythromycin ophthalmic ointment Apply to eye.    . valACYclovir (VALTREX) 1000 MG tablet Take by mouth.    . Vitamin D, Ergocalciferol, (DRISDOL) 50000 units CAPS capsule Take one capsule twice a week for 12 weeks.    Marland Kitchen. amLODipine (NORVASC) 5 MG tablet Take 5 mg by mouth daily.    . fluticasone (FLONASE) 50 MCG/ACT nasal spray Place 2 sprays into both nostrils daily. 16 g 0  . gabapentin (NEURONTIN) 100 MG capsule Take 1 capsule (100 mg total) by mouth at bedtime. 30 capsule 11  . levocetirizine (XYZAL) 5 MG tablet Take 1 tablet (5 mg total) by mouth every evening. 30 tablet 0  . levonorgestrel (MIRENA) 20 MCG/24HR IUD 1 each by Intrauterine route once.    Marland Kitchen. lisinopril-hydrochlorothiazide (PRINZIDE,ZESTORETIC) 20-12.5 MG tablet Take 1 tablet by mouth daily.    . metoprolol (LOPRESSOR) 50 MG tablet Take 100 mg by mouth every morning.    . phentermine 15 MG capsule Take 15 mg by mouth daily.    . potassium chloride (K-DUR) 10 MEQ tablet Take 10 mEq by mouth daily.    .  pravastatin (PRAVACHOL) 40 MG tablet Take 40 mg by mouth daily.    Marland Kitchen. SHINGRIX injection     . Topiramate (TOPAMAX PO) Take by mouth.    . topiramate (TOPAMAX) 50 MG tablet      No current facility-administered medications for this visit.     Patient confirms/reports the following allergies:  No Known Allergies  No orders of the defined types were placed in this encounter.   AUTHORIZATION INFORMATION Primary Insurance: 1D#: Group #:  Secondary Insurance: 1D#: Group #:  SCHEDULE INFORMATION: Date: 10/29 Time: Location: [facility]

## 2017-04-16 ENCOUNTER — Other Ambulatory Visit: Payer: BC Managed Care – PPO

## 2017-04-16 ENCOUNTER — Ambulatory Visit: Payer: BC Managed Care – PPO | Admitting: Obstetrics and Gynecology

## 2017-05-06 ENCOUNTER — Other Ambulatory Visit: Payer: BC Managed Care – PPO

## 2017-05-06 ENCOUNTER — Ambulatory Visit: Payer: BC Managed Care – PPO | Admitting: Obstetrics and Gynecology

## 2017-05-28 ENCOUNTER — Ambulatory Visit (INDEPENDENT_AMBULATORY_CARE_PROVIDER_SITE_OTHER): Payer: BC Managed Care – PPO

## 2017-05-28 ENCOUNTER — Ambulatory Visit (INDEPENDENT_AMBULATORY_CARE_PROVIDER_SITE_OTHER): Payer: BC Managed Care – PPO | Admitting: Obstetrics and Gynecology

## 2017-05-28 ENCOUNTER — Encounter: Payer: Self-pay | Admitting: Obstetrics and Gynecology

## 2017-05-28 VITALS — BP 146/92 | HR 80 | Ht 65.0 in | Wt 226.0 lb

## 2017-05-28 DIAGNOSIS — Z975 Presence of (intrauterine) contraceptive device: Secondary | ICD-10-CM | POA: Diagnosis not present

## 2017-05-28 DIAGNOSIS — N939 Abnormal uterine and vaginal bleeding, unspecified: Secondary | ICD-10-CM

## 2017-05-29 NOTE — Progress Notes (Signed)
Gynecology Ultrasound Follow Up  Chief Complaint:  Chief Complaint  Patient presents with  . U/S follow up    IUD placement     History of Present Illness: Patient is a 51 y.o. female who presents today for ultrasound evaluation of spotting for 2 week with Mirena IUD in place, has since discontinued.  Ultrasound demonstrates the following findgins Adnexa: normal Uterus: Normal in appearance other than small anterior fundal fibroid 24.24mm x 30.65mm,  with endometrial stripe 7.79mm, IUD is within the endometrial cavity, arms appear properly deployed, and the bottom lower end of the the IUD body sits at the level of the internal cervical os Additional: no free fluid  Review of Systems: Review of Systems  Constitutional: Negative for chills and fever.  HENT: Negative for congestion.   Respiratory: Negative for cough and shortness of breath.   Cardiovascular: Negative for chest pain and palpitations.  Gastrointestinal: Negative for abdominal pain, constipation, diarrhea, heartburn, nausea and vomiting.  Genitourinary: Negative for dysuria, frequency and urgency.  Skin: Negative for itching and rash.  Neurological: Negative for dizziness and headaches.  Endo/Heme/Allergies: Negative for polydipsia.  Psychiatric/Behavioral: Negative for depression.    Past Medical History:  Past Medical History:  Diagnosis Date  . GERD (gastroesophageal reflux disease)   . Hyperlipemia   . Hypertension   . Pre-diabetes   . Sleep apnea 10/02/2012   [facility] - Mild    Past Surgical History:  Past Surgical History:  Procedure Laterality Date  . CATARACT EXTRACTION W/PHACO Right 01/22/2017   Procedure: CATARACT EXTRACTION PHACO AND INTRAOCULAR LENS PLACEMENT (IOC)  Right Complicated;  Surgeon: Lockie Mola, MD;  Location: [facility];  Service: Ophthalmology;  Laterality: Right;   healon 5 vision blue  . CERVICAL POLYPECTOMY  2015    Gynecologic History:  No LMP  recorded. Patient is not currently having periods (Reason: IUD). Contraception: IUD Last Pap: 03/12/16 Results were: .NIL and HR HPV positive  Family History:  Family History  Problem Relation Age of Onset  . Breast cancer Sister 70  . Breast cancer Maternal Aunt 19    Social History:  Social History   Social History  . Marital status: Single    Spouse name: N/A  . Number of children: N/A  . Years of education: N/A   Occupational History  . Not on file.   Social History Main Topics  . Smoking status: Never Smoker  . Smokeless tobacco: Never Used  . Alcohol use No     Comment: may have 1 glass wine/month  . Drug use: No  . Sexual activity: Yes   Other Topics Concern  . Not on file   Social History Narrative  . No narrative on file    Allergies:  No Known Allergies  Medications: Prior to Admission medications   Medication Sig Start Date End Date Taking? Authorizing Provider  amLODipine (NORVASC) 5 MG tablet Take 5 mg by mouth daily.    [provider]  clindamycin (CLEOCIN) 300 MG capsule Take by mouth. 06/30/15   [provider]  erythromycin ophthalmic ointment Apply to eye. 06/29/15   [provider]  fluticasone (FLONASE) 50 MCG/ACT nasal spray Place 2 sprays into both nostrils daily. 01/19/17   Tommie Sams, DO  gabapentin (NEURONTIN) 100 MG capsule Take 1 capsule (100 mg total) by mouth at bedtime. 03/28/17 04/27/17  Vena Austria, MD  levocetirizine (XYZAL) 5 MG tablet Take 1 tablet (5 mg total) by mouth every evening. 01/19/17  Tommie Sams, DO  levonorgestrel (MIRENA) 20 MCG/24HR IUD 1 each by Intrauterine route once.    [provider]  lisinopril-hydrochlorothiazide (PRINZIDE,ZESTORETIC) 20-12.5 MG tablet Take 1 tablet by mouth daily.    [provider]  metoprolol (LOPRESSOR) 50 MG tablet Take 100 mg by mouth every morning.    [provider]  phentermine 15 MG capsule Take 15 mg by mouth daily.     [provider]  potassium chloride (K-DUR) 10 MEQ tablet Take 10 mEq by mouth daily.    [provider]  pravastatin (PRAVACHOL) 40 MG tablet Take 40 mg by mouth daily.    [provider]  [facility] injection  02/24/17   [provider]  Topiramate (TOPAMAX PO) Take by mouth.    [provider]  topiramate (TOPAMAX) 50 MG tablet  03/03/17   [provider]  valACYclovir (VALTREX) 1000 MG tablet Take by mouth. 04/30/16   [provider]  Vitamin D, Ergocalciferol, (DRISDOL) 50000 units CAPS capsule Take one capsule twice a week for 12 weeks. 08/10/14   [provider]    Physical Exam Vitals: Blood pressure (!) 146/92, pulse 80, height  (1.651 m), weight 226 lb (102.5 kg).  General: NAD HEENT: normocephalic, anicteric Pulmonary: No increased work of breathing Extremities: no edema, erythema, or tenderness Neurologic: Grossly intact, normal gait Psychiatric: mood appropriate, affect full   Assessment: 51 y.o. IUD breakthrough bleeding (AUB-I)  Plan: Problem List Items Addressed This Visit    None      1) IUD breakthrough bleeding - IUD appears in location within the uterus, if continues spotting consider endometrial biopsy.  Does have small anterior fundal fibroid.  Appearance of follicles on ovary would argue against patient being menopausal at this time.  At present patient opts for expectant management.  2) A total of 15 minutes were spent in face-to-face contact with the patient during this encounter with over half of that time devoted to counseling and coordination of care.

## 2017-06-24 ENCOUNTER — Encounter: Payer: Self-pay | Admitting: *Deleted

## 2017-06-27 NOTE — Discharge Instructions (Signed)
General Anesthesia, Adult, Care After °These instructions provide you with information about caring for yourself after your procedure. Your health care provider may also give you more specific instructions. Your treatment has been planned according to current medical practices, but problems sometimes occur. Call your health care provider if you have any problems or questions after your procedure. °What can I expect after the procedure? °After the procedure, it is common to have: °· Vomiting. °· A sore throat. °· Mental slowness. ° °It is common to feel: °· Nauseous. °· Cold or shivery. °· Sleepy. °· Tired. °· Sore or achy, even in parts of your body where you did not have surgery. ° °Follow these instructions at home: °For at least 24 hours after the procedure: °· Do not: °? Participate in activities where you could fall or become injured. °? Drive. °? Use heavy machinery. °? Drink alcohol. °? Take sleeping pills or medicines that cause drowsiness. °? Make important decisions or sign legal documents. °? Take care of children on your own. °· Rest. °Eating and drinking °· If you vomit, drink water, juice, or soup when you can drink without vomiting. °· Drink enough fluid to keep your urine clear or pale yellow. °· Make sure you have little or no nausea before eating solid foods. °· Follow the diet recommended by your health care provider. °General instructions °· Have a responsible adult stay with you until you are awake and alert. °· Return to your normal activities as told by your health care provider. Ask your health care provider what activities are safe for you. °· Take over-the-counter and prescription medicines only as told by your health care provider. °· If you smoke, do not smoke without supervision. °· Keep all follow-up visits as told by your health care provider. This is important. °Contact a health care provider if: °· You continue to have nausea or vomiting at home, and medicines are not helpful. °· You  cannot drink fluids or start eating again. °· You cannot urinate after 8-12 hours. °· You develop a skin rash. °· You have fever. °· You have increasing redness at the site of your procedure. °Get help right away if: °· You have difficulty breathing. °· You have chest pain. °· You have unexpected bleeding. °· You feel that you are having a life-threatening or urgent problem. °This information is not intended to replace advice given to you by your health care provider. Make sure you discuss any questions you have with your health care provider. °Document Released: 11/25/2000 Document Revised: 01/22/2016 Document Reviewed: 08/03/2015 °Elsevier Interactive Patient Education © 2018 [employer] ° °

## 2017-07-14 ENCOUNTER — Ambulatory Visit: Payer: BC Managed Care – PPO | Admitting: Anesthesiology

## 2017-07-14 ENCOUNTER — Encounter
Admission: RE | Disposition: A | Discharge status: Home / Self Care | Payer: Self-pay | Source: Ambulatory Visit | Attending: Gastroenterology

## 2017-07-14 ENCOUNTER — Ambulatory Visit
Admission: RE | Admit: 2017-07-14 | Discharge: 2017-07-14 | Disposition: A | Discharge status: Home / Self Care | Payer: BC Managed Care – PPO | Source: Ambulatory Visit | Attending: Gastroenterology | Admitting: Gastroenterology

## 2017-07-14 DIAGNOSIS — Z79899 Other long term (current) drug therapy: Secondary | ICD-10-CM | POA: Insufficient documentation

## 2017-07-14 DIAGNOSIS — Z1211 Encounter for screening for malignant neoplasm of colon: Secondary | ICD-10-CM

## 2017-07-14 DIAGNOSIS — E785 Hyperlipidemia, unspecified: Secondary | ICD-10-CM | POA: Diagnosis not present

## 2017-07-14 DIAGNOSIS — G4730 Sleep apnea, unspecified: Secondary | ICD-10-CM | POA: Diagnosis not present

## 2017-07-14 DIAGNOSIS — I10 Essential (primary) hypertension: Secondary | ICD-10-CM | POA: Diagnosis not present

## 2017-07-14 DIAGNOSIS — K219 Gastro-esophageal reflux disease without esophagitis: Secondary | ICD-10-CM | POA: Diagnosis not present

## 2017-07-14 DIAGNOSIS — R7303 Prediabetes: Secondary | ICD-10-CM | POA: Diagnosis not present

## 2017-07-14 HISTORY — PX: COLONOSCOPY WITH PROPOFOL: SHX5780

## 2017-07-14 SURGERY — COLONOSCOPY WITH PROPOFOL
Anesthesia: General | Site: Rectum | Wound class: Contaminated

## 2017-07-14 MED ORDER — LACTATED RINGERS IV SOLN
INTRAVENOUS | Status: DC
  Administered 2017-07-14: 09:00:00 via INTRAVENOUS

## 2017-07-14 MED ORDER — PROPOFOL 10 MG/ML IV BOLUS
INTRAVENOUS | Status: DC | PRN
Start: 2017-07-14 — End: 2017-07-14
  Administered 2017-07-14 (×2): 20 mg via INTRAVENOUS
  Administered 2017-07-14: 50 mg via INTRAVENOUS
  Administered 2017-07-14: 100 mg via INTRAVENOUS
  Administered 2017-07-14: 40 mg via INTRAVENOUS

## 2017-07-14 MED ORDER — STERILE WATER FOR IRRIGATION IR SOLN
Status: DC | PRN
  Administered 2017-07-14: 10:00:00

## 2017-07-14 MED ORDER — LIDOCAINE HCL (CARDIAC) 20 MG/ML IV SOLN
INTRAVENOUS | Status: DC | PRN
  Administered 2017-07-14: 50 mg via INTRAVENOUS

## 2017-07-14 SURGICAL SUPPLY — 23 items
CANISTER SUCT 1200ML W/VALVE (MISCELLANEOUS) ×3 IMPLANT
CLIP HMST 235XBRD CATH ROT (MISCELLANEOUS) IMPLANT
CLIP RESOLUTION 360 11X235 (MISCELLANEOUS)
FCP ESCP3.2XJMB 240X2.8X (MISCELLANEOUS)
FORCEPS BIOP RAD 4 LRG CAP 4 (CUTTING FORCEPS) IMPLANT
FORCEPS BIOP RJ4 240 W/NDL (MISCELLANEOUS)
FORCEPS ESCP3.2XJMB 240X2.8X (MISCELLANEOUS) IMPLANT
GOWN CVR UNV OPN BCK APRN NK (MISCELLANEOUS) ×2 IMPLANT
GOWN ISOL THUMB LOOP REG UNIV (MISCELLANEOUS) ×6
INJECTOR VARIJECT VIN23 (MISCELLANEOUS) IMPLANT
KIT DEFENDO VALVE AND CONN (KITS) IMPLANT
KIT ENDO PROCEDURE OLY (KITS) ×3 IMPLANT
MARKER SPOT ENDO TATTOO 5ML (MISCELLANEOUS) IMPLANT
PAD GROUND ADULT SPLIT (MISCELLANEOUS) IMPLANT
PROBE APC STR FIRE (PROBE) IMPLANT
RETRIEVER NET ROTH 2.5X230 LF (MISCELLANEOUS) IMPLANT
SNARE SHORT THROW 13M SML OVAL (MISCELLANEOUS) IMPLANT
SNARE SHORT THROW 30M LRG OVAL (MISCELLANEOUS) IMPLANT
SNARE SNG USE RND 15MM (INSTRUMENTS) IMPLANT
SPOT EX ENDOSCOPIC TATTOO (MISCELLANEOUS)
TRAP ETRAP POLY (MISCELLANEOUS) IMPLANT
VARIJECT INJECTOR VIN23 (MISCELLANEOUS)
WATER STERILE IRR 250ML POUR (IV SOLUTION) ×3 IMPLANT

## 2017-07-14 NOTE — Anesthesia Postprocedure Evaluation (Signed)
Anesthesia Post Note  Patient: Jennifer Mueller  Procedure(s) Performed: COLONOSCOPY WITH PROPOFOL (N/A Rectum)  Patient location during evaluation: PACU Anesthesia Type: General Level of consciousness: awake Pain management: pain level controlled Vital Signs Assessment: post-procedure vital signs reviewed and stable Respiratory status: spontaneous breathing Cardiovascular status: blood pressure returned to baseline Postop Assessment: no headache Anesthetic complications: no    Beckey DowningEric Robinette Esters

## 2017-07-14 NOTE — Transfer of Care (Deleted)
Immediate Anesthesia Transfer of Care Note  Patient: Jennifer Mueller  Procedure(s) Performed: COLONOSCOPY WITH PROPOFOL (N/A )  Patient Location: PACU  Anesthesia Type: General  Level of Consciousness: awake, alert  and patient cooperative  Airway and Oxygen Therapy: Patient Spontanous Breathing and Patient connected to supplemental oxygen  Post-op Assessment: Post-op Vital signs reviewed, Patient's Cardiovascular Status Stable, Respiratory Function Stable, Patent Airway and No signs of Nausea or vomiting  Post-op Vital Signs: Reviewed and stable  Complications: No apparent anesthesia complications

## 2017-07-14 NOTE — Anesthesia Preprocedure Evaluation (Addendum)
Anesthesia Evaluation  Patient identified by MRN, date of birth, ID band Patient awake    Reviewed: Allergy & Precautions, NPO status , Patient's Chart, lab work & pertinent test results, reviewed documented beta blocker date and time   Airway Mallampati: III  TM Distance: >3 FB Neck ROM: Full    Dental  (+) Upper Dentures   Pulmonary sleep apnea ,    Pulmonary exam normal breath sounds clear to auscultation       Cardiovascular hypertension, Normal cardiovascular exam Rhythm:Regular Rate:Normal     Neuro/Psych negative neurological ROS  negative psych ROS   GI/Hepatic Neg liver ROS, GERD  ,  Endo/Other  negative endocrine ROS  Renal/GU negative Renal ROS  negative genitourinary   Musculoskeletal negative musculoskeletal ROS (+)   Abdominal (+) + obese,   Peds  Hematology negative hematology ROS (+)   Anesthesia Other Findings   Reproductive/Obstetrics negative OB ROS                            Anesthesia Physical Anesthesia Plan  ASA: II  Anesthesia Plan: General   Post-op Pain Management:    Induction: Intravenous  PONV Risk Score and Plan:   Airway Management Planned: Natural Airway  Additional Equipment: None  Intra-op Plan:   Post-operative Plan:   Informed Consent: I have reviewed the patients History and Physical, chart, labs and discussed the procedure including the risks, benefits and alternatives for the proposed anesthesia with the patient or authorized representative who has indicated his/her understanding and acceptance.     Plan Discussed with: CRNA, Anesthesiologist and Surgeon  Anesthesia Plan Comments:         Anesthesia Quick Evaluation

## 2017-07-14 NOTE — H&P (Signed)
Midge Miniumarren Carigan Lister, MD [facility] [address] [place], [place] [address] Phone: [phone] Fax : [phone]  Primary Care Physician:  Duard Larsen[place], [facility] Primary Gastroenterologist:  Dr. Servando SnareWohl  Pre-Procedure History & Physical: HPI:  Jennifer Mueller is a 51 y.o. female is here for a screening colonoscopy.   Past Medical History:  Diagnosis Date  . GERD (gastroesophageal reflux disease)   . Hyperlipemia   . Hypertension   . Pre-diabetes   . Sleep apnea 10/02/2012   [facility] - Mild    Past Surgical History:  Procedure Laterality Date  . CERVICAL POLYPECTOMY  2015    Prior to Admission medications   Medication Sig Start Date End Date Taking? Authorizing Provider  amoxicillin (AMOXIL) 125 MG/5ML suspension Take 125 mg 2 (two) times daily by mouth.   Yes [provider]  methocarbamol (ROBAXIN) 500 MG tablet Take 500 mg 3 (three) times daily as needed by mouth for muscle spasms.   Yes [provider]  amLODipine (NORVASC) 5 MG tablet Take 5 mg by mouth daily.    [provider]  clindamycin (CLEOCIN) 300 MG capsule Take by mouth. 06/30/15   [provider]  erythromycin ophthalmic ointment Apply to eye. 06/29/15   [provider]  fluticasone (FLONASE) 50 MCG/ACT nasal spray Place 2 sprays into both nostrils daily. 01/19/17   Tommie Samsook, Jayce G, DO  gabapentin (NEURONTIN) 100 MG capsule Take 1 capsule (100 mg total) by mouth at bedtime. 03/28/17 04/27/17  Vena AustriaStaebler, Andreas, MD  levocetirizine (XYZAL) 5 MG tablet Take 1 tablet (5 mg total) by mouth every evening. 01/19/17   Tommie Samsook, Jayce G, DO  levonorgestrel (MIRENA) 20 MCG/24HR IUD 1 each by Intrauterine route once.    [provider]  lisinopril-hydrochlorothiazide (PRINZIDE,ZESTORETIC) 20-12.5 MG tablet Take 1 tablet by mouth daily.    [provider]  metoprolol (LOPRESSOR) 50 MG tablet Take 100 mg by mouth every morning.    [provider]    phentermine 15 MG capsule Take 15 mg by mouth daily.    [provider]  potassium chloride (K-DUR) 10 MEQ tablet Take 10 mEq by mouth daily.    [provider]  pravastatin (PRAVACHOL) 40 MG tablet Take 40 mg by mouth daily.    [provider]  [facility]  02/[facility]17   [provider]  Topiramate (TOPAMAX PO) Take by mouth.    [provider]  topiramate (TOPAMAX) 50 MG tablet  03/03/17   [provider]  valACYclovir (VALTREX) 1000 MG tablet Take by mouth. 04/30/16   [provider]  Vitamin D, Ergocalciferol, (DRISDOL) 50000 units CAPS capsule Take one capsule twice a week for 12 weeks. 08/10/14   [provider]    Allergies as of 04/14/2017  . (No Known Allergies)    Family History  Problem Relation Age of Onset  . Breast cancer Sister 3140  . Breast cancer Maternal Aunt 4958    Social History   Socioeconomic History  . Marital status: Single    Spouse name: Not on file  . Number of children: Not on file  . Years of education: Not on file  . Highest education level: Not on file  Social Needs  . Financial resource strain: Not on file  . Food insecurity - worry: Not on file  . Food insecurity - inability: Not on file  . Transportation needs - medical: Not on file  . Transportation needs - non-medical: Not on file  Occupational History  . Not on file  Tobacco Use  . Smoking status: Never Smoker  . Smokeless tobacco: Never Used  Substance and Sexual Activity  . Alcohol use: No    Comment: may have 1 glass wine/month  . Drug use: No  . Sexual activity: Yes  Other Topics Concern  . Not on file  Social History Narrative  . Not on file    Review of Systems: See HPI, otherwise negative ROS  Physical Exam: Ht 5\' 5"  (1.651 m)   Wt 226 lb (102.5 kg)   BMI 37.61 kg/m  General:   Alert,  pleasant and cooperative in NAD Head:  Normocephalic and atraumatic. Neck:  Supple; no masses or  thyromegaly. Lungs:  Clear throughout to auscultation.    Heart:  Regular rate and rhythm. Abdomen:  Soft, nontender and nondistended. Normal bowel sounds, without guarding, and without rebound.   Neurologic:  Alert and  oriented x4;  grossly normal neurologically.  Impression/Plan: Jennifer Mueller is now here to undergo a screening colonoscopy.  Risks, benefits, and alternatives regarding colonoscopy have been reviewed with the patient.  Questions have been answered.  All parties agreeable.

## 2017-07-14 NOTE — H&P (Signed)
Midge Miniumarren Angelisse Riso, MD [facility] [address] [place], [place] [address] Phone: [phone] Fax : [phone]  Primary Care Physician:  Duard [place], [facility] Primary Gastroenterologist:  Dr. Servando SnareWohl  Pre-Procedure History & Physical: HPI:  Jennifer Mueller is a 51 y.o. female is here for a screening colonoscopy.   Past Medical History:  Diagnosis Date  . GERD (gastroesophageal reflux disease)   . Hyperlipemia   . Hypertension   . Pre-diabetes   . Sleep apnea 10/02/2012   [facility] - Mild    Past Surgical History:  Procedure Laterality Date  . CERVICAL POLYPECTOMY  2015    Prior to Admission medications   Medication Sig Start Date End Date Taking? Authorizing Provider  amLODipine (NORVASC) 5 MG tablet Take 5 mg by mouth daily.   Yes [provider]  fluticasone (FLONASE) 50 MCG/ACT nasal spray Place 2 sprays into both nostrils daily. 01/19/17  Yes Cook, Verdis FredericksonJayce G, DO  levonorgestrel (MIRENA) 20 MCG/24HR IUD 1 each by Intrauterine route once.   Yes [provider]  lisinopril-hydrochlorothiazide (PRINZIDE,ZESTORETIC) 20-12.5 MG tablet Take 1 tablet by mouth daily.   Yes [provider]  methocarbamol (ROBAXIN) 500 MG tablet Take 500 mg 3 (three) times daily as needed by mouth for muscle spasms.   Yes [provider]  metoprolol (LOPRESSOR) 50 MG tablet Take 100 mg by mouth every morning.   Yes [provider]  phentermine 15 MG capsule Take 15 mg by mouth daily.   Yes [provider]  potassium chloride (K-DUR) 10 MEQ tablet Take 10 mEq by mouth daily.   Yes [provider]  pravastatin (PRAVACHOL) 40 MG tablet Take 40 mg by mouth daily.   Yes [provider]  topiramate (TOPAMAX) 50 MG tablet  03/03/17  Yes [provider]  valACYclovir (VALTREX) 1000 MG tablet Take by mouth. 04/30/16  Yes [provider]  Vitamin D, Ergocalciferol, (DRISDOL) 50000 units CAPS capsule Take one capsule  twice a week for 12 weeks. 08/10/14  Yes [provider]  amoxicillin (AMOXIL) 125 MG/5ML suspension Take 125 mg 2 (two) times daily by mouth.    [provider]  clindamycin (CLEOCIN) 300 MG capsule Take by mouth. 06/30/15   [provider]  erythromycin ophthalmic ointment Apply to eye. 06/29/15   [provider]  gabapentin (NEURONTIN) 100 MG capsule Take 1 capsule (100 mg total) by mouth at bedtime. 03/28/17 04/27/17  Vena AustriaStaebler, Andreas, MD  levocetirizine (XYZAL) 5 MG tablet Take 1 tablet (5 mg total) by mouth every evening. Patient not taking: Reported on 07/14/2017 01/19/17   Tommie Samsook, Jayce G, DO  [facility]  02/24/17   [provider]  Topiramate (TOPAMAX PO) Take by mouth.    [provider]    Allergies as of 04/14/2017  . (No Known Allergies)    Family History  Problem Relation Age of Onset  . Breast cancer Sister 7140  . Breast cancer Maternal Aunt 5758    Social History   Socioeconomic History  . Marital status: Single    Spouse name: Not on file  . Number of children: Not on file  . Years of education: Not on file  . Highest education level: Not on file  Social Needs  . Financial resource strain: Not on file  . Food insecurity - worry: Not on file  . Food insecurity - inability: Not on file  . Transportation needs - medical: Not on file  . Transportation needs -  non-medical: Not on file  Occupational History  . Not on file  Tobacco Use  . Smoking status: Never Smoker  . Smokeless tobacco: Never Used  Substance and Sexual Activity  . Alcohol use: No    Comment: may have 1 glass wine/month  . Drug use: No  . Sexual activity: Yes  Other Topics Concern  . Not on file  Social History Narrative  . Not on file    Review of Systems: See HPI, otherwise negative ROS  Physical Exam: BP (!) 143/82   Pulse 63   Temp 97.9 F (36.6 C) (Tympanic)   Resp 16   Ht 5\' 5"  (1.651 m)   Wt 221 lb (100.2 kg)   SpO2  99%   BMI 36.78 kg/m  General:   Alert,  pleasant and cooperative in NAD Head:  Normocephalic and atraumatic. Neck:  Supple; no masses or thyromegaly. Lungs:  Clear throughout to auscultation.    Heart:  Regular rate and rhythm. Abdomen:  Soft, nontender and nondistended. Normal bowel sounds, without guarding, and without rebound.   Neurologic:  Alert and  oriented x4;  grossly normal neurologically.  Impression/Plan: Jennifer Mueller is now here to undergo a screening colonoscopy.  Risks, benefits, and alternatives regarding colonoscopy have been reviewed with the patient.  Questions have been answered.  All parties agreeable.

## 2017-07-14 NOTE — Anesthesia Procedure Notes (Signed)
Date/Time: 07/14/2017 9:30 AM Performed by: Maree KrabbeWarren, Nolie Bignell, CRNA Pre-anesthesia Checklist: Patient identified, Emergency Drugs available, Suction available, Timeout performed and Patient being monitored Patient Re-evaluated:Patient Re-evaluated prior to induction Oxygen Delivery Method: Nasal cannula Placement Confirmation: positive ETCO2

## 2017-07-14 NOTE — Transfer of Care (Signed)
Immediate Anesthesia Transfer of Care Note  Patient: Jennifer Mueller  Procedure(s) Performed: COLONOSCOPY WITH PROPOFOL (N/A Rectum)  Patient Location: PACU  Anesthesia Type: General  Level of Consciousness: awake, alert  and patient cooperative  Airway and Oxygen Therapy: Patient Spontanous Breathing and Patient connected to supplemental oxygen  Post-op Assessment: Post-op Vital signs reviewed, Patient's Cardiovascular Status Stable, Respiratory Function Stable, Patent Airway and No signs of Nausea or vomiting  Post-op Vital Signs: Reviewed and stable  Complications: No apparent anesthesia complications

## 2017-07-14 NOTE — Op Note (Signed)
[facility] Gastroenterology Patient Name: Jennifer Mueller Procedure Date: 07/14/2017 9:25 AM MRN: [id number] Account #: [id number] Date of Birth: 1966/04/04 Admit Type: Outpatient Age: 51 Room: [facility] OR ROOM 01 Gender: Female Note Status: Finalized Procedure:            Colonoscopy Indications:          Screening for colorectal malignant neoplasm Providers:            Midge Minium MD, MD Referring MD:         Florina Ou. Staebler (Referring MD) Medicines:            Propofol per Anesthesia Complications:        No immediate complications. Procedure:            Pre-Anesthesia Assessment:                       - Prior to the procedure, a History and Physical was                        performed, and patient medications and allergies were                        reviewed. The patient's tolerance of previous                        anesthesia was also reviewed. The risks and benefits of                        the procedure and the sedation options and risks were                        discussed with the patient. All questions were                        answered, and informed consent was obtained. Prior                        Anticoagulants: The patient has taken no previous                        anticoagulant or antiplatelet agents. ASA Grade                        Assessment: II - A patient with mild systemic disease.                        After reviewing the risks and benefits, the patient was                        deemed in satisfactory condition to undergo the                        procedure.                       After obtaining informed consent, the colonoscope was                        passed under direct vision. Throughout the procedure,  the patient's blood pressure, pulse, and oxygen                        saturations were monitored continuously. The Olympus CF                        H180AL Colonoscope (S#: [id number]) was introduced  through                        the anus and advanced to the the cecum, identified by                        appendiceal orifice and ileocecal valve. The                        colonoscopy was performed without difficulty. The                        patient tolerated the procedure well. The quality of                        the bowel preparation was excellent. Findings:      The perianal and digital rectal examinations were normal.      The colon (entire examined portion) appeared normal. Impression:           - The entire examined colon is normal.                       - No specimens collected. Recommendation:       - Discharge patient to home.                       - Resume previous diet.                       - Continue present medications.                       - Repeat colonoscopy in 10 years for screening unless                        any change in family history or lower GI problems. Procedure Code(s):    --- Professional ---                       [id number], Colonoscopy, flexible; diagnostic, including                        collection of specimen(s) by brushing or washing, when                        performed (separate procedure) Diagnosis Code(s):    --- Professional ---                       Z12.11, Encounter for screening for malignant neoplasm                        of colon CPT copyright 2016 [employer]. All rights reserved. The codes documented in this report are preliminary and upon coder review may  be revised to meet current compliance requirements. Ger Ringenberg  Muriah Harsha MD, MD 07/14/2017 9:46:31 AM This report has been signed electronically. Number of Addenda: 0 Note Initiated On: 07/14/2017 9:25 AM Scope Withdrawal Time: 0 hours 6 minutes 34 seconds  Total Procedure Duration: 0 hours 9 minutes 44 seconds       [facility]

## 2017-07-15 ENCOUNTER — Encounter: Payer: Self-pay | Admitting: Gastroenterology

## 2017-07-25 ENCOUNTER — Other Ambulatory Visit: Payer: Self-pay

## 2017-07-25 ENCOUNTER — Ambulatory Visit
Admission: EM | Admit: 2017-07-25 | Discharge: 2017-07-25 | Disposition: A | Discharge status: Home / Self Care | Payer: BC Managed Care – PPO | Attending: Family Medicine | Admitting: Family Medicine

## 2017-07-25 DIAGNOSIS — N898 Other specified noninflammatory disorders of vagina: Secondary | ICD-10-CM

## 2017-07-25 DIAGNOSIS — N939 Abnormal uterine and vaginal bleeding, unspecified: Secondary | ICD-10-CM | POA: Diagnosis not present

## 2017-07-25 DIAGNOSIS — Z113 Encounter for screening for infections with a predominantly sexual mode of transmission: Secondary | ICD-10-CM

## 2017-07-25 DIAGNOSIS — N760 Acute vaginitis: Secondary | ICD-10-CM | POA: Diagnosis not present

## 2017-07-25 DIAGNOSIS — B9689 Other specified bacterial agents as the cause of diseases classified elsewhere: Secondary | ICD-10-CM

## 2017-07-25 LAB — WET PREP, GENITAL
SPERM: NONE SEEN
TRICH WET PREP: NONE SEEN
YEAST WET PREP: NONE SEEN

## 2017-07-25 LAB — CHLAMYDIA/NGC RT PCR (ARMC ONLY)
Chlamydia Tr: NOT DETECTED
N GONORRHOEAE: NOT DETECTED

## 2017-07-25 MED ORDER — METRONIDAZOLE 500 MG PO TABS: 500.0000 mg | ORAL_TABLET | Freq: Two times a day (BID) | ORAL | 0 refills | Status: DC

## 2017-07-25 NOTE — ED Provider Notes (Signed)
[facility]    CSN: [id number] Arrival date & time: 07/25/17  1857     History   Chief Complaint Chief Complaint  Patient presents with  . Vaginal Bleeding    HPI Jennifer Mueller is a 51 y.o. female.   51 yo female with a c/o vaginal spotting for the past week associated with itching. Tried otc monistat without relief. States sexually active with new partner. Also states has a h/o uterine fibroid. Patient is not menopausal. Denies fevers, chills, or abdominal/pelvic pain.    The history is provided by the patient.    Past Medical History:  Diagnosis Date  . GERD (gastroesophageal reflux disease)   . Hyperlipemia   . Hypertension   . Pre-diabetes   . Sleep apnea 10/02/2012   [facility] - Mild    Patient Active Problem List   Diagnosis Date Noted  . Special screening for malignant neoplasms, colon     Past Surgical History:  Procedure Laterality Date  . CATARACT EXTRACTION W/PHACO Right 01/22/2017   Procedure: CATARACT EXTRACTION PHACO AND INTRAOCULAR LENS PLACEMENT (IOC)  Right Complicated;  Surgeon: Lockie MolaBrasington, Chadwick, MD;  [facility];  Service: Ophthalmology;  Laterality: Right;   healon 5 vision blue  . CERVICAL POLYPECTOMY  2015  . COLONOSCOPY WITH PROPOFOL N/A 07/14/2017   Procedure: COLONOSCOPY WITH PROPOFOL;  Surgeon: Midge MiniumWohl, Darren, MD;  [facility];  Service: Endoscopy;  Laterality: N/A;  sleep apnea    OB History    No data available       Home Medications    Prior to Admission medications   Medication Sig Start Date End Date Taking? Authorizing Provider  amLODipine (NORVASC) 5 MG tablet Take 5 mg by mouth daily.   Yes [provider]  fluticasone (FLONASE) 50 MCG/ACT nasal spray Place 2 sprays into both nostrils daily. 01/19/17  Yes Cook, Verdis FredericksonJayce G, DO  levonorgestrel (MIRENA) 20 MCG/24HR IUD 1 each by Intrauterine route once.   Yes [provider]  lisinopril-hydrochlorothiazide  (PRINZIDE,ZESTORETIC) 20-12.5 MG tablet Take 1 tablet by mouth daily.   Yes [provider]  metoprolol (LOPRESSOR) 50 MG tablet Take 100 mg by mouth every morning.   Yes [provider]  phentermine 15 MG capsule Take 15 mg by mouth daily.   Yes [provider]  potassium chloride (K-DUR) 10 MEQ tablet Take 10 mEq by mouth daily.   Yes [provider]  pravastatin (PRAVACHOL) 40 MG tablet Take 40 mg by mouth daily.   Yes [provider]  Topiramate (TOPAMAX PO) Take by mouth.   Yes [provider]  topiramate (TOPAMAX) 50 MG tablet  03/03/17  Yes [provider]  valACYclovir (VALTREX) 1000 MG tablet Take by mouth. 04/30/16  Yes [provider]  amoxicillin (AMOXIL) 125 MG/5ML suspension Take 125 mg 2 (two) times daily by mouth.    [provider]  clindamycin (CLEOCIN) 300 MG capsule Take by mouth. 06/30/15   [provider]  erythromycin ophthalmic ointment Apply to eye. 06/29/15   [provider]  gabapentin (NEURONTIN) 100 MG capsule Take 1 capsule (100 mg total) by mouth at bedtime. 03/28/17 04/27/17  Vena AustriaStaebler, Andreas, MD  levocetirizine (XYZAL) 5 MG tablet Take 1 tablet (5 mg total) by mouth every evening. Patient not taking: Reported on 07/14/2017 01/19/17   Tommie Samsook, Jayce G, DO  methocarbamol (ROBAXIN) 500 MG tablet Take 500 mg 3 (three) times daily as needed by mouth for muscle spasms.  [provider]  metroNIDAZOLE (FLAGYL) 500 MG tablet Take 1 tablet (500 mg total) by mouth 2 (two) times daily. 07/25/17   Payton Mccallumonty, Dariyah Garduno, MD  [facility]  02/24/17   [provider]  Vitamin D, Ergocalciferol, (DRISDOL) 50000 units CAPS capsule Take one capsule twice a week for 12 weeks. 08/10/14   [provider]    Family History Family History  Problem Relation Age of Onset  . Breast cancer Sister 7040  . Breast cancer Maternal Aunt 4858    Social History Social History    Tobacco Use  . Smoking status: Never Smoker  . Smokeless tobacco: Never Used  Substance Use Topics  . Alcohol use: No    Comment: may have 1 glass wine/month  . Drug use: No     Allergies   Patient has no known allergies.   Review of Systems Review of Systems   Physical Exam Triage Vital Signs ED Triage Vitals  Enc Vitals Group     BP 07/25/17 1946 (!) 158/105     Pulse Rate 07/25/17 1946 85     Resp 07/25/17 1946 18     Temp 07/25/17 1946 97.9 F (36.6 C)     Temp Source 07/25/17 1946 Oral     SpO2 07/25/17 1946 100 %     Weight 07/25/17 1942 220 lb (99.8 kg)     Height 07/25/17 1942 5\' 5"  (1.651 m)     Head Circumference --      Peak Flow --      Pain Score 07/25/17 1942 1     Pain Loc --      Pain Edu? --      Excl. in GC? --    No data found.  Updated Vital Signs BP (!) 158/105 (BP Location: Left Arm) Comment: forgot to take medication today  Pulse 85   Temp 97.9 F (36.6 C) (Oral)   Resp 18   Ht 5\' 5"  (1.651 m)   Wt 220 lb (99.8 kg)   SpO2 100%   BMI 36.61 kg/m   Visual Acuity Right Eye Distance:   Left Eye Distance:   Bilateral Distance:    Right Eye Near:   Left Eye Near:    Bilateral Near:     Physical Exam  Constitutional: She appears well-developed and well-nourished. No distress.  Genitourinary: Pelvic exam was performed with patient supine. No labial fusion. There is no rash, tenderness, lesion or injury on the right labia. There is no rash, tenderness, lesion or injury on the left labia. Cervix exhibits no discharge and no friability. There is bleeding (mild blood present) in the vagina. No erythema or tenderness in the vagina. No foreign body in the vagina. No signs of injury around the vagina. Vaginal discharge (mild) found.  Lymphadenopathy:       Right: No inguinal adenopathy present.       Left: No inguinal adenopathy present.  Skin: Skin is warm and dry. No rash noted. She is not diaphoretic. No erythema.  Nursing note and  vitals reviewed.    UC Treatments / Results  Labs (all labs ordered are listed, but only abnormal results are displayed) Labs Reviewed  WET PREP, GENITAL - Abnormal; Notable for the following components:      Result Value   Clue Cells Wet Prep HPF POC PRESENT (*)    WBC, Wet Prep HPF POC FEW (*)    All other components within normal limits  CHLAMYDIA/NGC RT PCR ([facility] ONLY)  EKG  EKG Interpretation None       Radiology No results found.  Procedures Procedures (including critical care time)  Medications Ordered in UC Medications - No data to display   Initial Impression / Assessment and Plan / UC Course  I have reviewed the triage vital signs and the nursing notes.  Pertinent labs & imaging results that were available during my care of the patient were reviewed by me and considered in my medical decision making (see chart for details).       Final Clinical Impressions(s) / UC Diagnoses   Final diagnoses:  Vaginal bleeding  BV (bacterial vaginosis)    ED Discharge Orders        Ordered    metroNIDAZOLE (FLAGYL) 500 MG tablet  2 times daily     07/25/17 2036     1. Lab results and diagnosis reviewed with patient; await GC/chlamydia 2. rx as per orders above; reviewed possible side effects, interactions, risks and benefits  3. Recommend supportive treatment with  4. Recommend follow up with her GYN provider 5.  Follow-up prn if symptoms worsen or don't improve Controlled Substance Prescriptions [place] Controlled Substance Registry consulted? Not Applicable   Payton Mccallum, MD 07/25/17 2116

## 2017-07-25 NOTE — ED Triage Notes (Signed)
Patient states that she has been having unusual vaginal spotting. Patient states that she recently has a new partner. Patient states that she has been having vaginal itching and has tried OTC monistat. Patient states that she tried OTC medication and noticed bleeding when putting medication in vagina.

## 2017-09-26 ENCOUNTER — Telehealth: Payer: Self-pay | Admitting: Obstetrics and Gynecology

## 2017-09-26 NOTE — Telephone Encounter (Signed)
-----   Message from [place], [place] sent at 09/25/2017 7:09 PM EST -----    Appointment Request From: [name]    With Provider: Vena AustriaAndreas Staebler, MD [[facility]]    Preferred Date Range: Any date 10/10/2017 or later    Preferred Times: Any time    Reason for visit: Office Visit    Comments:  4:00   Called and left voicemail for patient to call back to be schedule

## 2017-11-26 ENCOUNTER — Encounter: Payer: Self-pay | Admitting: Emergency Medicine

## 2017-11-26 ENCOUNTER — Other Ambulatory Visit: Payer: Self-pay

## 2017-11-26 ENCOUNTER — Ambulatory Visit
Admission: EM | Admit: 2017-11-26 | Discharge: 2017-11-26 | Disposition: A | Discharge status: Home / Self Care | Payer: BC Managed Care – PPO | Attending: Family Medicine | Admitting: Family Medicine

## 2017-11-26 DIAGNOSIS — J019 Acute sinusitis, unspecified: Secondary | ICD-10-CM | POA: Diagnosis not present

## 2017-11-26 MED ORDER — AMOXICILLIN-POT CLAVULANATE 875-125 MG PO TABS: 1.0000 | ORAL_TABLET | Freq: Two times a day (BID) | ORAL | 0 refills | Status: DC

## 2017-11-26 NOTE — ED Triage Notes (Addendum)
Patient in today c/o nasal congestion, pressure and pain, cough, headache x 1 month. Patient denies fever, but has had chills.  Patient also states that when showering on Monday that she felt a knot under her right arm.

## 2017-11-26 NOTE — ED Provider Notes (Signed)
[facility]  CSN: [id number] Arrival date & time: 11/26/17  1550  History   Chief Complaint Chief Complaint  Patient presents with  . Nasal Congestion   HPI  52 year old female presents with sinus pressure and congestion.  1 month history of congestion, sinus pain/pressure. Also reports cough, runny nose, sneezing.  Symptoms are severe.  No improvement with OTC Dayquil and Nyquil. No fever. 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  No known exacerbating factors.  No other associated symptoms.  No other complaints at this time.  Past Medical History:  Diagnosis Date  . GERD (gastroesophageal reflux disease)   . Hyperlipemia   . Hypertension   . Pre-diabetes   . Sleep apnea 10/02/2012   [facility] - Mild   Patient Active Problem List   Diagnosis Date Noted  . Special screening for malignant neoplasms, colon    Past Surgical History:  Procedure Laterality Date  . CATARACT EXTRACTION W/PHACO Right 01/22/2017   Procedure: CATARACT EXTRACTION PHACO AND INTRAOCULAR LENS PLACEMENT (IOC)  Right Complicated;  Surgeon: Lockie Mola, MD;  [facility];  Service: Ophthalmology;  Laterality: Right;   healon 5 vision blue  . CERVICAL POLYPECTOMY  2015  . COLONOSCOPY WITH PROPOFOL N/A 07/14/2017   Procedure: COLONOSCOPY WITH PROPOFOL;  Surgeon: Midge Minium, MD;  Location: [facility];  Service: Endoscopy;  Laterality: N/A;  sleep apnea   OB History   None    Home Medications    Prior to Admission medications   Medication Sig Start Date End Date Taking? Authorizing Provider  amLODipine (NORVASC) 5 MG tablet Take 5 mg by mouth daily.   Yes [provider]  levonorgestrel (MIRENA) 20 MCG/24HR IUD 1 each by Intrauterine route once.   Yes [provider]  lisinopril-hydrochlorothiazide (PRINZIDE,ZESTORETIC) 20-12.5 MG tablet Take 1 tablet by mouth daily.   Yes [provider]  metoprolol  (LOPRESSOR) 50 MG tablet Take 100 mg by mouth every morning.   Yes [provider]  phentermine 15 MG capsule Take 15 mg by mouth daily.   Yes [provider]  potassium chloride (K-DUR) 10 MEQ tablet Take 10 mEq by mouth daily.   Yes [provider]  topiramate (TOPAMAX) 50 MG tablet  03/03/17  Yes [provider]  valACYclovir (VALTREX) 1000 MG tablet Take by mouth. 04/30/16  Yes [provider]  amoxicillin-clavulanate (AUGMENTIN) 875-125 MG tablet Take 1 tablet by mouth every 12 (twelve) hours. 11/26/17   Tommie Sams, DO  fluticasone (FLONASE) 50 MCG/ACT nasal spray Place 2 sprays into both nostrils daily. 01/19/17   Tommie Sams, DO  pravastatin (PRAVACHOL) 40 MG tablet Take 40 mg by mouth daily.    [provider]    Family History Family History  Problem Relation Age of Onset  . Breast cancer Sister 22  . Breast cancer Maternal Aunt 30    Social History Social History   Tobacco Use  . Smoking status: Never Smoker  . Smokeless tobacco: Never Used  Substance Use Topics  . Alcohol use: No    Comment: may have 1 glass wine/month  . Drug use: No     Allergies   Patient has no known allergies.   Review of Systems Review of Systems Per HPI  Physical Exam Triage Vital Signs ED Triage Vitals  Enc Vitals Group     BP 11/26/17 1603 (!) 149/87     Pulse Rate 11/26/17 1603  65     Resp 11/26/17 1603 16     Temp 11/26/17 1603 98.2 F (36.8 C)     Temp Source 11/26/17 1603 Oral     SpO2 11/26/17 1603 100 %     Weight 11/26/17 1603 220 lb (99.8 kg)     Height 11/26/17 1603 5\' 5"  (1.651 m)     Head Circumference --      Peak Flow --      Pain Score 11/26/17 1602 7     Pain Loc --      Pain Edu? --      Excl. in GC? --    Updated Vital Signs BP (!) 149/87 (BP Location: Left Arm)   Pulse 65   Temp 98.2 F (36.8 C) (Oral)   Resp 16   Ht 5\' 5"  (1.651 m)   Wt 220 lb (99.8 kg)   SpO2 100%   BMI 36.61 kg/m    Physical Exam  Constitutional: She is oriented to person, place, and time. She appears well-developed. No distress.  HENT:  Head: Normocephalic and atraumatic.  Mouth/Throat: Oropharynx is clear and moist.  Eyes: Conjunctivae are normal. Right eye exhibits no discharge. Left eye exhibits no discharge.  Neck: Neck supple.  Cardiovascular: Normal rate and regular rhythm.  Pulmonary/Chest: Effort normal and breath sounds normal.  Lymphadenopathy:    She has no cervical adenopathy.  Neurological: She is alert and oriented to person, place, and time.  Psychiatric: She has a normal mood and affect. Her behavior is normal.  Nursing note and vitals reviewed.  UC Treatments / Results  Labs (all labs ordered are listed, but only abnormal results are displayed) Labs Reviewed - No data to display  EKG None Radiology No results found.  Procedures Procedures (including critical care time)  Medications Ordered in UC Medications - No data to display   Initial Impression / Assessment and Plan / UC Course  I have reviewed the triage vital signs and the nursing notes.  Pertinent labs & imaging results that were available during my care of the patient were reviewed by me and considered in my medical decision making (see chart for details).     52 year old female presents with sinusitis.  Treating with Augmentin.  Final Clinical Impressions(s) / UC Diagnoses   Final diagnoses:  Acute sinusitis, recurrence not specified, unspecified location    ED Discharge Orders        Ordered    amoxicillin-clavulanate (AUGMENTIN) 875-125 MG tablet  Every 12 hours     11/26/17 1628     Controlled Substance Prescriptions [place] Controlled Substance Registry consulted? Not Applicable   Tommie SamsCook, Previn Jian G, DO 11/26/17 1702

## 2018-06-17 ENCOUNTER — Ambulatory Visit (INDEPENDENT_AMBULATORY_CARE_PROVIDER_SITE_OTHER): Payer: BC Managed Care – PPO | Admitting: Obstetrics and Gynecology

## 2018-06-17 ENCOUNTER — Other Ambulatory Visit (HOSPITAL_COMMUNITY)
Admission: RE | Admit: 2018-06-17 | Discharge: 2018-06-17 | Disposition: A | Discharge status: Home / Self Care | Payer: BC Managed Care – PPO | Source: Ambulatory Visit | Attending: Obstetrics and Gynecology | Admitting: Obstetrics and Gynecology

## 2018-06-17 ENCOUNTER — Encounter: Payer: Self-pay | Admitting: Obstetrics and Gynecology

## 2018-06-17 VITALS — BP 126/84 | HR 68 | Ht 65.0 in | Wt 237.0 lb

## 2018-06-17 DIAGNOSIS — Z124 Encounter for screening for malignant neoplasm of cervix: Secondary | ICD-10-CM

## 2018-06-17 DIAGNOSIS — Z1239 Encounter for other screening for malignant neoplasm of breast: Secondary | ICD-10-CM | POA: Diagnosis not present

## 2018-06-17 DIAGNOSIS — Z113 Encounter for screening for infections with a predominantly sexual mode of transmission: Secondary | ICD-10-CM | POA: Diagnosis not present

## 2018-06-17 DIAGNOSIS — Z01411 Encounter for gynecological examination (general) (routine) with abnormal findings: Secondary | ICD-10-CM | POA: Diagnosis not present

## 2018-06-17 DIAGNOSIS — Z01419 Encounter for gynecological examination (general) (routine) without abnormal findings: Secondary | ICD-10-CM

## 2018-06-17 DIAGNOSIS — N951 Menopausal and female climacteric states: Secondary | ICD-10-CM | POA: Diagnosis not present

## 2018-06-17 NOTE — Patient Instructions (Signed)
[facility] [address] [place] [address]  [facility] [place]  [address] [place] [address]  Phone: [phone]  

## 2018-06-17 NOTE — Progress Notes (Signed)
Gynecology Annual Exam  PCP: [facility]  Chief Complaint:  Chief Complaint  Patient presents with  . Gynecologic Exam    History of Present Illness:Patient is a 52 y.o. No obstetric history on file. presents for annual exam. The patient has no complaints today.   LMP: Patient's last menstrual period was 06/02/2018 (exact date). Mirena placed 04/15/2014  The patient is sexually active. She denies dyspareunia.  The patient does perform self breast exams.  There is no notable family history of breast or ovarian cancer in her family.  The patient wears seatbelts: yes.   The patient has regular exercise: not asked.    The patient denies current symptoms of depression.     Review of Systems: Review of Systems  Constitutional: Negative for chills and fever.  HENT: Negative for congestion.   Respiratory: Negative for cough and shortness of breath.   Cardiovascular: Negative for chest pain and palpitations.  Gastrointestinal: Negative for abdominal pain, constipation, diarrhea, heartburn, nausea and vomiting.  Genitourinary: Negative for dysuria, frequency and urgency.  Skin: Negative for itching and rash.  Neurological: Negative for dizziness and headaches.  Endo/Heme/Allergies: Negative for polydipsia.  Psychiatric/Behavioral: Negative for depression.    Past Medical History:  Past Medical History:  Diagnosis Date  . GERD (gastroesophageal reflux disease)   . Hyperlipemia   . Hypertension   . Pre-diabetes   . Sleep apnea 10/02/2012   [facility] - Mild    Past Surgical History:  Past Surgical History:  Procedure Laterality Date  . CATARACT EXTRACTION W/PHACO Right 01/22/2017   Procedure: CATARACT EXTRACTION PHACO AND INTRAOCULAR LENS PLACEMENT (IOC)  Right Complicated;  Surgeon: Lockie Mola, MD;  [facility];  Service: Ophthalmology;  Laterality: Right;   healon 5 vision blue  . CERVICAL POLYPECTOMY  2015  .  COLONOSCOPY WITH PROPOFOL N/A 07/14/2017   Procedure: COLONOSCOPY WITH PROPOFOL;  Surgeon: Midge Minium, MD;  Location: [facility];  Service: Endoscopy;  Laterality: N/A;  sleep apnea    Gynecologic History:  Patient's last menstrual period was 06/02/2018 (exact date). Last Pap: Results were: 03/12/2016 NIL and HR HPV negative  Last mammogram: 11/06/2016 Results were: BI-RAD I  Obstetric History: No obstetric history on file.  Family History:  Family History  Problem Relation Age of Onset  . Breast cancer Sister 2  . Breast cancer Maternal Aunt 24    Social History:  Social History   Socioeconomic History  . Marital status: Single    Spouse name: Not on file  . Number of children: Not on file  . Years of education: Not on file  . Highest education level: Not on file  Occupational History  . Not on file  Social Needs  . Financial resource strain: Not on file  . Food insecurity:    Worry: Not on file    Inability: Not on file  . Transportation needs:    Medical: Not on file    Non-medical: Not on file  Tobacco Use  . Smoking status: Never Smoker  . Smokeless tobacco: Never Used  Substance and Sexual Activity  . Alcohol use: No    Comment: may have 1 glass wine/month  . Drug use: No  . Sexual activity: Yes  Lifestyle  . Physical activity:    Days per week: Not on file    Minutes per session: Not on file  . Stress: Not on file  Relationships  . Social connections:    Talks  on phone: Not on file    Gets together: Not on file    Attends religious service: Not on file    Active member of club or organization: Not on file    Attends meetings of clubs or organizations: Not on file    Relationship status: Not on file  . Intimate partner violence:    Fear of current or ex partner: Not on file    Emotionally abused: Not on file    Physically abused: Not on file    Forced sexual activity: Not on file  Other Topics Concern  . Not on file  Social History  Narrative  . Not on file    Allergies:  No Known Allergies  Medications: Prior to Admission medications   Medication Sig Start Date End Date Taking? Authorizing Provider  amLODipine (NORVASC) 5 MG tablet Take 5 mg by mouth daily.    [provider]  amoxicillin-clavulanate (AUGMENTIN) 875-125 MG tablet Take 1 tablet by mouth every 12 (twelve) hours. 11/26/17   Tommie Sams, DO  fluticasone (FLONASE) 50 MCG/ACT nasal spray Place 2 sprays into both nostrils daily. 01/19/17   Tommie Sams, DO  levonorgestrel (MIRENA) 20 MCG/24HR IUD 1 each by Intrauterine route once.    [provider]  lisinopril-hydrochlorothiazide (PRINZIDE,ZESTORETIC) 20-12.5 MG tablet Take 1 tablet by mouth daily.    [provider]  metoprolol (LOPRESSOR) 50 MG tablet Take 100 mg by mouth every morning.    [provider]  phentermine 15 MG capsule Take 15 mg by mouth daily.    [provider]  potassium chloride (K-DUR) 10 MEQ tablet Take 10 mEq by mouth daily.    [provider]  pravastatin (PRAVACHOL) 40 MG tablet Take 40 mg by mouth daily.    [provider]  topiramate (TOPAMAX) 50 MG tablet  03/03/17   [provider]  valACYclovir (VALTREX) 1000 MG tablet Take by mouth. 04/30/16   [provider]    Physical Exam Vitals: There were no vitals taken for this visit.  General: NAD HEENT: normocephalic, anicteric Thyroid: no enlargement, no palpable nodules Pulmonary: No increased work of breathing, CTAB Cardiovascular: RRR, distal pulses 2+ Breast: Breast symmetrical, no tenderness, no palpable nodules or masses, no skin or nipple retraction present, no nipple discharge.  No axillary or supraclavicular lymphadenopathy. Abdomen: NABS, soft, non-tender, non-distended.  Umbilicus without lesions.  No hepatomegaly, splenomegaly or masses palpable. No evidence of hernia  Genitourinary:  External: Normal external female genitalia.  Normal  urethral meatus, normal Bartholin's and Skene's glands.    Vagina: Normal vaginal mucosa, no evidence of prolapse.    Cervix: Grossly normal in appearance, no bleeding, IUD strings visualized  Uterus: Non-enlarged, mobile, normal contour.  No CMT  Adnexa: ovaries non-enlarged, no adnexal masses  Rectal: deferred  Lymphatic: no evidence of inguinal lymphadenopathy Extremities: no edema, erythema, or tenderness Neurologic: Grossly intact Psychiatric: mood appropriate, affect full  Female chaperone present for pelvic and breast  portions of the physical exam     Assessment: 52 y.o. No obstetric history on file. routine annual exam  Plan: Problem List Items Addressed This Visit    None    Visit Diagnoses    Encounter for gynecological examination without abnormal finding    -  Primary   Relevant Orders   HEP, RPR, HIV Panel (Completed)   Screening for malignant neoplasm of cervix       Relevant Orders   Cytology - PAP   Breast  screening       Relevant Orders   MM 3D SCREEN BREAST BILATERAL   Routine screening for STI (sexually transmitted infection)       Relevant Orders   Cytology - PAP   HEP, RPR, HIV Panel (Completed)   Menopausal vasomotor syndrome       Relevant Orders   Follicle stimulating hormone (Completed)   Estradiol (Completed)      1) Mammogram - recommend yearly screening mammogram.  Mammogram Was ordered today  2) STI screening  wasoffered and accepted  3) ASCCP guidelines and rational discussed.  Patient opts for every 3 years screening interval  4) Osteoporosis  - per USPTF routine screening DEXA at age 35  5) Routine healthcare maintenance including cholesterol, diabetes screening discussed managed by PCP  6) Mirena IUD - placed  04/15/2014, will check FSH/estradiol to see if IUD can be discontinued  7) Colonoscopy - 07/24/2017 Dr. Servando Snare follow recommended in 10 years  8) Return in about 1 year (around 06/18/2019) for annual.    Vena Austria, MD Domingo Pulse, [facility] 06/17/2018, 1:30 PM

## 2018-06-18 LAB — HEP, RPR, HIV PANEL
HIV SCREEN 4TH GENERATION: NONREACTIVE
Hepatitis B Surface Ag: NEGATIVE
RPR Ser Ql: NONREACTIVE

## 2018-06-18 LAB — ESTRADIOL: Estradiol: 238.3 pg/mL

## 2018-06-18 LAB — FOLLICLE STIMULATING HORMONE: FSH: 7.1 m[IU]/mL

## 2018-06-22 LAB — CYTOLOGY - PAP
CHLAMYDIA, DNA PROBE: NEGATIVE
Diagnosis: NEGATIVE
HPV: NOT DETECTED
NEISSERIA GONORRHEA: NEGATIVE

## 2018-06-24 ENCOUNTER — Encounter: Payer: Self-pay | Admitting: Obstetrics and Gynecology

## 2018-06-30 ENCOUNTER — Ambulatory Visit
Admission: RE | Admit: 2018-06-30 | Discharge: 2018-06-30 | Disposition: A | Discharge status: Home / Self Care | Payer: BC Managed Care – PPO | Source: Ambulatory Visit | Attending: Obstetrics and Gynecology | Admitting: Obstetrics and Gynecology

## 2018-06-30 DIAGNOSIS — Z1239 Encounter for other screening for malignant neoplasm of breast: Secondary | ICD-10-CM | POA: Insufficient documentation

## 2018-10-02 ENCOUNTER — Ambulatory Visit (INDEPENDENT_AMBULATORY_CARE_PROVIDER_SITE_OTHER): Payer: BC Managed Care – PPO | Admitting: Obstetrics and Gynecology

## 2018-10-02 ENCOUNTER — Encounter: Payer: Self-pay | Admitting: Obstetrics and Gynecology

## 2018-10-02 VITALS — BP 132/72 | Wt 240.0 lb

## 2018-10-02 DIAGNOSIS — N9089 Other specified noninflammatory disorders of vulva and perineum: Secondary | ICD-10-CM

## 2018-10-02 MED ORDER — SULFAMETHOXAZOLE-TRIMETHOPRIM 800-160 MG PO TABS: 1.0000 | ORAL_TABLET | Freq: Two times a day (BID) | ORAL | 1 refills | Status: DC

## 2018-10-02 NOTE — Progress Notes (Signed)
Obstetrics & Gynecology Office Visit   Chief Complaint:  Chief Complaint  Patient presents with  . lump on bottom    knot on left butt cheek x 2 weeks    History of Present Illness: Ms. Jennifer Mueller is a 53 y.o. No obstetric history on file. who LMP was No LMP recorded. (Menstrual status: IUD)., presents today for a problem visit.   Patient complains of an small tender pea size knot perineum/left labia.  Has been getting smaller overall.  Associated symptoms include local tenderness and irritation. No inciting event or trauma.  Does not have a history of recurrent boils or abscess formation in the groin or armpits.  No draiange noted.  No fevers, no chills..  Review of Systems: Review of Systems  Constitutional: Negative.   Genitourinary: Negative.   Skin: Positive for rash. Negative for itching.     Past Medical History:  Past Medical History:  Diagnosis Date  . Family history of breast cancer    10/19 genetic testing letter sent  . GERD (gastroesophageal reflux disease)   . Hyperlipemia   . Hypertension   . Pre-diabetes   . Sleep apnea 10/02/2012   [facility] - Mild    Past Surgical History:  Past Surgical History:  Procedure Laterality Date  . CATARACT EXTRACTION W/PHACO Right 01/22/2017   Procedure: CATARACT EXTRACTION PHACO AND INTRAOCULAR LENS PLACEMENT (IOC)  Right Complicated;  Surgeon: Lockie Mola, MD;  Location: [facility];  Service: Ophthalmology;  Laterality: Right;   healon 5 vision blue  . CERVICAL POLYPECTOMY  2015  . COLONOSCOPY WITH PROPOFOL N/A 07/14/2017   Procedure: COLONOSCOPY WITH PROPOFOL;  Surgeon: Midge Minium, MD;  Location: [facility];  Service: Endoscopy;  Laterality: N/A;  sleep apnea    Gynecologic History: No LMP recorded. (Menstrual status: IUD).  Obstetric History: No obstetric history on file.  Family History:  Family History  Problem Relation Age of Onset  . Breast cancer Sister 81  . Breast  cancer Maternal Aunt 39    Social History:  Social History   Socioeconomic History  . Marital status: Single    Spouse name: Not on file  . Number of children: Not on file  . Years of education: Not on file  . Highest education level: Not on file  Occupational History  . Not on file  Social Needs  . Financial resource strain: Not on file  . Food insecurity:    Worry: Not on file    Inability: Not on file  . Transportation needs:    Medical: Not on file    Non-medical: Not on file  Tobacco Use  . Smoking status: Never Smoker  . Smokeless tobacco: Never Used  Substance and Sexual Activity  . Alcohol use: No    Comment: may have 1 glass wine/month  . Drug use: No  . Sexual activity: Yes  Lifestyle  . Physical activity:    Days per week: Not on file    Minutes per session: Not on file  . Stress: Not on file  Relationships  . Social connections:    Talks on phone: Not on file    Gets together: Not on file    Attends religious service: Not on file    Active member of club or organization: Not on file    Attends meetings of clubs or organizations: Not on file    Relationship status: Not on file  . Intimate partner violence:  Fear of current or ex partner: Not on file    Emotionally abused: Not on file    Physically abused: Not on file    Forced sexual activity: Not on file  Other Topics Concern  . Not on file  Social History Narrative  . Not on file    Allergies:  No Known Allergies  Medications: Prior to Admission medications   Medication Sig Start Date End Date Taking? Authorizing Provider  amLODipine (NORVASC) 5 MG tablet Take 5 mg by mouth daily.   Yes [provider]  fluticasone (FLONASE) 50 MCG/ACT nasal spray Place 2 sprays into both nostrils daily. 01/19/17  Yes Cook, Verdis FredericksonJayce G, DO  levonorgestrel (MIRENA) 20 MCG/24HR IUD 1 each by Intrauterine route once.   Yes [provider]  lisinopril-hydrochlorothiazide (PRINZIDE,ZESTORETIC)  20-12.5 MG tablet Take 1 tablet by mouth daily.   Yes [provider]  metoprolol (LOPRESSOR) 50 MG tablet Take 100 mg by mouth every morning.   Yes [provider]  phentermine 15 MG capsule Take 15 mg by mouth daily.   Yes [provider]  potassium chloride (K-DUR) 10 MEQ tablet Take 10 mEq by mouth daily.   Yes [provider]  pravastatin (PRAVACHOL) 40 MG tablet Take 40 mg by mouth daily.   Yes [provider]  topiramate (TOPAMAX) 50 MG tablet  03/03/17  Yes [provider]  valACYclovir (VALTREX) 1000 MG tablet Take by mouth. 04/30/16  Yes [provider]  sulfamethoxazole-trimethoprim (BACTRIM DS,SEPTRA DS) 800-160 MG tablet Take 1 tablet by mouth 2 (two) times daily. 10/02/18   Vena AustriaStaebler, Audrina Marten, MD    Physical Exam Vitals:  Vitals:   10/02/18 1457  BP: 132/72   No LMP recorded. (Menstrual status: IUD).  General: NAD HEENT: normocephalic, anicteric Thyroid: no enlargement, no palpable nodules Pulmonary: No increased work of breathing Cardiovascular: RRR, distal pulses 2+ Abdomen: NABS, soft, non-tender, non-distended.  Umbilicus without lesions.  No hepatomegaly, splenomegaly or masses palpable. No evidence of hernia  Genitourinary:  External: Normal external female genitalia.  Normal urethral meatus, normal  Bartholin's and Skene's glands.    Vagina: Normal vaginal mucosa, no evidence of prolapse.    Cervix: Grossly normal in appearance, no bleeding  Uterus: Non-enlarged, mobile, normal contour.  No CMT  Adnexa: ovaries non-enlarged, no adnexal masses  Rectal: deferred  Lymphatic: no evidence of inguinal lymphadenopathy Extremities: no edema, erythema, or tenderness Neurologic: Grossly intact Psychiatric: mood appropriate, affect full  Female chaperone present for pelvic  portions of the physical exam  Assessment: 53 y.o. with labial foliculitis  Plan: Problem List Items Addressed This Visit    None      Visit Diagnoses    Caruncle of labium    -  Primary     1) Small early foliculitis - Rx bactrim provided.  Discussed warm compress prn and stiz baths.    2) A total of 15 minutes were spent in face-to-face contact with the patient during this encounter with over half of that time devoted to counseling and coordination of care.  3) Return in about 9 months (around 07/03/2019) for annual.   Vena AustriaAndreas Joe Gee, MD, Merlinda FrederickFACOG [facility], [facility] 10/06/2018, 12:53 PM

## 2019-04-02 ENCOUNTER — Other Ambulatory Visit: Payer: Self-pay

## 2019-04-02 DIAGNOSIS — Z20822 Contact with and (suspected) exposure to covid-19: Secondary | ICD-10-CM

## 2019-04-04 LAB — NOVEL CORONAVIRUS, NAA: SARS-CoV-2, NAA: NOT DETECTED

## 2019-04-23 ENCOUNTER — Other Ambulatory Visit: Payer: Self-pay | Admitting: Family Medicine

## 2019-05-24 ENCOUNTER — Other Ambulatory Visit: Payer: Self-pay | Admitting: Family Medicine

## 2019-05-24 DIAGNOSIS — Z1231 Encounter for screening mammogram for malignant neoplasm of breast: Secondary | ICD-10-CM

## 2019-08-06 ENCOUNTER — Ambulatory Visit (INDEPENDENT_AMBULATORY_CARE_PROVIDER_SITE_OTHER): Payer: BC Managed Care – PPO | Admitting: Obstetrics and Gynecology

## 2019-08-06 ENCOUNTER — Encounter: Payer: Self-pay | Admitting: Obstetrics and Gynecology

## 2019-08-06 ENCOUNTER — Other Ambulatory Visit: Payer: Self-pay

## 2019-08-06 VITALS — BP 118/84 | HR 75 | Ht 65.0 in | Wt 238.0 lb

## 2019-08-06 DIAGNOSIS — N912 Amenorrhea, unspecified: Secondary | ICD-10-CM

## 2019-08-06 DIAGNOSIS — Z20828 Contact with and (suspected) exposure to other viral communicable diseases: Secondary | ICD-10-CM

## 2019-08-06 DIAGNOSIS — Z20822 Contact with and (suspected) exposure to covid-19: Secondary | ICD-10-CM

## 2019-08-06 DIAGNOSIS — Z113 Encounter for screening for infections with a predominantly sexual mode of transmission: Secondary | ICD-10-CM

## 2019-08-06 DIAGNOSIS — Z01419 Encounter for gynecological examination (general) (routine) without abnormal findings: Secondary | ICD-10-CM

## 2019-08-06 DIAGNOSIS — N951 Menopausal and female climacteric states: Secondary | ICD-10-CM

## 2019-08-06 DIAGNOSIS — Z1239 Encounter for other screening for malignant neoplasm of breast: Secondary | ICD-10-CM

## 2019-08-06 DIAGNOSIS — Z1152 Encounter for screening for COVID-19: Secondary | ICD-10-CM

## 2019-08-06 NOTE — Patient Instructions (Signed)
[facility] [address] [place] [address]  [facility] [place]  [address] [place] [address]  Phone: [phone]  

## 2019-08-06 NOTE — Progress Notes (Signed)
Gynecology Annual Exam  PCP: Cathie Hoops, PA  Chief Complaint:  Chief Complaint  Patient presents with  . Gynecologic Exam    STD screening per pt request    History of Present Illness:Patient is a 53 y.o. No obstetric history on file. presents for annual exam. The patient has no complaints today.   LMP: No LMP recorded. (Menstrual status: IUD).  The patient is sexually active. She denies dyspareunia.  The patient does perform self breast exams.  There is notable family history of breast or ovarian cancer in her family.  The patient wears seatbelts: yes.   The patient has regular exercise: not asked.    The patient reports current symptoms of depression. Managed by PCP and psych.    Review of Systems: Review of Systems  Constitutional: Negative for chills and fever.  HENT: Negative for congestion.   Respiratory: Negative for cough and shortness of breath.   Cardiovascular: Negative for chest pain and palpitations.  Gastrointestinal: Negative for abdominal pain, constipation, diarrhea, heartburn, nausea and vomiting.  Genitourinary: Negative for dysuria, frequency and urgency.  Skin: Negative for itching and rash.  Neurological: Negative for dizziness and headaches.  Endo/Heme/Allergies: Negative for polydipsia.  Psychiatric/Behavioral: Negative for depression.    Past Medical History:  Past Medical History:  Diagnosis Date  . Family history of breast cancer    10/19 genetic testing letter sent  . GERD (gastroesophageal reflux disease)   . Hyperlipemia   . Hypertension   . Pre-diabetes   . Sleep apnea 10/02/2012   [facility] - Mild    Past Surgical History:  Past Surgical History:  Procedure Laterality Date  . CATARACT EXTRACTION W/PHACO Right 01/22/2017   Procedure: CATARACT EXTRACTION PHACO AND INTRAOCULAR LENS PLACEMENT (IOC)  Right Complicated;  Surgeon: Lockie Mola, MD;  Location: [facility];  Service: Ophthalmology;   Laterality: Right;   healon 5 vision blue  . CERVICAL POLYPECTOMY  2015  . COLONOSCOPY WITH PROPOFOL N/A 07/14/2017   Procedure: COLONOSCOPY WITH PROPOFOL;  Surgeon: Midge Minium, MD;  Location: [facility];  Service: Endoscopy;  Laterality: N/A;  sleep apnea    Gynecologic History:  No LMP recorded. (Menstrual status: IUD). Last Pap: Results were: 06/17/2018 NIL and HR HPV negative  Last mammogram: 06/30/2018 Results were: BI-RAD I  S/P BTL Mirena IUD 04/15/2014  Obstetric History: No obstetric history on file.  Family History:  Family History  Problem Relation Age of Onset  . Breast cancer Sister 39  . Breast cancer Maternal Aunt 61    Social History:  Social History   Socioeconomic History  . Marital status: Married    Spouse name: Not on file  . Number of children: Not on file  . Years of education: Not on file  . Highest education level: Not on file  Occupational History  . Not on file  Social Needs  . Financial resource strain: Not on file  . Food insecurity    Worry: Not on file    Inability: Not on file  . Transportation needs    Medical: Not on file    Non-medical: Not on file  Tobacco Use  . Smoking status: Never Smoker  . Smokeless tobacco: Never Used  Substance and Sexual Activity  . Alcohol use: No    Comment: may have 1 glass wine/month  . Drug use: No  . Sexual activity: Yes    Birth control/protection: I.U.D.  Lifestyle  . Physical activity  Days per week: Not on file    Minutes per session: Not on file  . Stress: Not on file  Relationships  . Social Musicianconnections    Talks on phone: Not on file    Gets together: Not on file    Attends religious service: Not on file    Active member of club or organization: Not on file    Attends meetings of clubs or organizations: Not on file    Relationship status: Not on file  . Intimate partner violence    Fear of current or ex partner: Not on file    Emotionally abused: Not on file     Physically abused: Not on file    Forced sexual activity: Not on file  Other Topics Concern  . Not on file  Social History Narrative  . Not on file    Allergies:  No Known Allergies  Medications: Prior to Admission medications   Medication Sig Start Date End Date Taking? Authorizing Provider  amLODipine (NORVASC) 5 MG tablet Take 5 mg by mouth daily.   Yes [provider]  fluticasone (FLONASE) 50 MCG/ACT nasal spray Place 2 sprays into both nostrils daily. 01/19/17  Yes Cook, Verdis FredericksonJayce G, DO  levonorgestrel (MIRENA) 20 MCG/24HR IUD 1 each by Intrauterine route once.   Yes [provider]  lisinopril-hydrochlorothiazide (PRINZIDE,ZESTORETIC) 20-12.5 MG tablet Take 1 tablet by mouth daily.   Yes [provider]  metoprolol (LOPRESSOR) 50 MG tablet Take 100 mg by mouth every morning.   Yes [provider]  phentermine 15 MG capsule Take 15 mg by mouth daily.   Yes [provider]  potassium chloride (K-DUR) 10 MEQ tablet Take 10 mEq by mouth daily.   Yes [provider]  pravastatin (PRAVACHOL) 40 MG tablet Take 40 mg by mouth daily.   Yes [provider]  topiramate (TOPAMAX) 50 MG tablet  03/03/17  Yes [provider]  valACYclovir (VALTREX) 1000 MG tablet Take by mouth. 04/30/16  Yes [provider]    Physical Exam Vitals: Blood pressure 118/84, pulse 75, height 5\' 5"  (1.651 m), weight 238 lb (108 kg).  General: NAD HEENT: normocephalic, anicteric Thyroid: no enlargement, no palpable nodules Pulmonary: No increased work of breathing, CTAB Cardiovascular: RRR, distal pulses 2+ Breast: Breast symmetrical, no tenderness, no palpable nodules or masses, no skin or nipple retraction present, no nipple discharge.  No axillary or supraclavicular lymphadenopathy. Abdomen: NABS, soft, non-tender, non-distended.  Umbilicus without lesions.  No hepatomegaly, splenomegaly or masses palpable. No evidence of hernia   Genitourinary:  External: Normal external female genitalia.  Normal urethral meatus, normal Bartholin's and Skene's glands.    Vagina: Normal vaginal mucosa, no evidence of prolapse.    Cervix: Grossly normal in appearance, no bleeding, IUD string visualized  Uterus: Non-enlarged, mobile, normal contour.  No CMT  Adnexa: ovaries non-enlarged, no adnexal masses  Rectal: deferred  Lymphatic: no evidence of inguinal lymphadenopathy Extremities: no edema, erythema, or tenderness Neurologic: Grossly intact Psychiatric: mood appropriate, affect full  Female chaperone present for pelvic and breast  portions of the physical exam     Assessment: 53 y.o. No obstetric history on file. routine annual exam  Plan: Problem List Items Addressed This Visit    None    Visit Diagnoses    Amenorrhea    -  Primary   Relevant Orders   FSH   Day 3 Estradiol   Vasomotor symptoms due to menopause  Relevant Orders   [place]   Day 3 Estradiol   Encounter for gynecological examination without abnormal finding       Breast screening       Relevant Orders   MM 3D SCREEN BREAST BILATERAL   Encounter for screening laboratory testing for COVID-19 virus in asymptomatic patient       Relevant Orders   Novel Coronavirus, NAA ([facility])   Routine screening for STI (sexually transmitted infection)       Relevant Orders   GC/Chlamydia Probe Amp([facility])   HEP, RPR, HIV Panel      1) Mammogram - recommend yearly screening mammogram.  Mammogram Was ordered today  2) STI screening  wasoffered and accepted  3) ASCCP guidelines and rational discussed.  Patient opts for every 3 years screening interval  4) Osteoporosis  - per USPTF routine screening DEXA at age 85  5) Routine healthcare maintenance including cholesterol, diabetes screening discussed managed by PCP  6) Colonoscopy UTD 2018  7) Works at [employer] now, employer requiring [place] testing sporadically.  Order placed  8) Vasomotor  symptoms - will check FSH/Estradiol  9)  Return in about 1 year (around 08/05/2020) for annual.    Malachy Mood, MD Mosetta Pigeon, [place] 08/06/2019, 9:02 AM

## 2019-08-07 LAB — HEP, RPR, HIV PANEL
HIV Screen 4th Generation wRfx: NONREACTIVE
Hepatitis B Surface Ag: NEGATIVE
RPR Ser Ql: NONREACTIVE

## 2019-08-07 LAB — FOLLICLE STIMULATING HORMONE: FSH: 6.5 m[IU]/mL

## 2019-08-07 LAB — ESTRADIOL: Estradiol: 37.2 pg/mL

## 2019-08-08 LAB — GC/CHLAMYDIA PROBE AMP
Chlamydia trachomatis, NAA: NEGATIVE
Neisseria Gonorrhoeae by PCR: NEGATIVE

## 2019-10-15 ENCOUNTER — Ambulatory Visit
Admission: RE | Admit: 2019-10-15 | Discharge: 2019-10-15 | Disposition: A | Discharge status: Home / Self Care | Payer: BC Managed Care – PPO | Source: Ambulatory Visit | Attending: Family Medicine | Admitting: Family Medicine

## 2019-10-15 DIAGNOSIS — Z1231 Encounter for screening mammogram for malignant neoplasm of breast: Secondary | ICD-10-CM | POA: Diagnosis present

## 2019-10-30 ENCOUNTER — Ambulatory Visit: Payer: BC Managed Care – PPO

## 2019-10-30 ENCOUNTER — Ambulatory Visit: Payer: BC Managed Care – PPO | Attending: Internal Medicine

## 2019-10-30 DIAGNOSIS — Z23 Encounter for immunization: Secondary | ICD-10-CM | POA: Insufficient documentation

## 2019-10-30 NOTE — Progress Notes (Signed)
   Covid-19 Vaccination Clinic  Name:  SHONNIE POUDRIER    MRN: [id number] DOB: [birth year]  10/30/2019  Ms. Griego was observed post Covid-19 immunization for 15 minutes without incidence. She was provided with Vaccine Information Sheet and instruction to access the V-Safe system.   Ms. Sloop was instructed to call 911 with any severe reactions post vaccine: Marland Kitchen Difficulty breathing  . Swelling of your face and throat  . A fast heartbeat  . A bad rash all over your body  . Dizziness and weakness    Immunizations Administered    Name Date Dose VIS Date Route   Moderna COVID-19 Vaccine 10/30/2019  2:34 PM 0.5 mL 08/03/2019 Intramuscular   Manufacturer: Moderna   Lot: [id number]   NDC: 65790-383-33

## 2019-11-27 ENCOUNTER — Ambulatory Visit: Payer: BC Managed Care – PPO | Attending: Internal Medicine

## 2019-11-27 DIAGNOSIS — Z23 Encounter for immunization: Secondary | ICD-10-CM

## 2019-11-27 NOTE — Progress Notes (Signed)
   Covid-19 Vaccination Clinic  Name:  GENEVE KIMPEL    MRN: [id number] DOB: [birth year]  11/27/2019  Ms. Leija was observed post Covid-19 immunization for 15 minutes without incident. She was provided with Vaccine Information Sheet and instruction to access the V-Safe system.   Ms. Zeidan was instructed to call 911 with any severe reactions post vaccine: Marland Kitchen Difficulty breathing  . Swelling of face and throat  . A fast heartbeat  . A bad rash all over body  . Dizziness and weakness   Immunizations Administered    Name Date Dose VIS Date Route   Moderna COVID-19 Vaccine 11/27/2019 11:39 AM 0.5 mL 08/03/2019 Intramuscular   Manufacturer: Moderna   Lot: [id number]   NDC: 66815-947-07

## 2019-12-31 ENCOUNTER — Telehealth: Payer: Self-pay

## 2019-12-31 NOTE — Telephone Encounter (Signed)
Pt is aware AMS is not in the office. Pt aware she may need to try OTC med or go to urgent care/PCP

## 2019-12-31 NOTE — Telephone Encounter (Signed)
Pt called triage reporting itching and burning she states shes been on amoxicillin, I told her she may have a yeast infection and I would see if AMS could send in a diflucan,

## 2020-03-22 ENCOUNTER — Other Ambulatory Visit (HOSPITAL_COMMUNITY)
Admission: RE | Admit: 2020-03-22 | Discharge: 2020-03-22 | Disposition: A | Discharge status: Home / Self Care | Payer: BC Managed Care – PPO | Source: Ambulatory Visit | Attending: Obstetrics and Gynecology | Admitting: Obstetrics and Gynecology

## 2020-03-22 ENCOUNTER — Encounter: Payer: Self-pay | Admitting: Obstetrics and Gynecology

## 2020-03-22 ENCOUNTER — Other Ambulatory Visit: Payer: Self-pay

## 2020-03-22 ENCOUNTER — Ambulatory Visit (INDEPENDENT_AMBULATORY_CARE_PROVIDER_SITE_OTHER): Payer: BC Managed Care – PPO | Admitting: Obstetrics and Gynecology

## 2020-03-22 VITALS — BP 100/90 | Ht 65.0 in | Wt 243.0 lb

## 2020-03-22 DIAGNOSIS — Z113 Encounter for screening for infections with a predominantly sexual mode of transmission: Secondary | ICD-10-CM | POA: Insufficient documentation

## 2020-03-22 NOTE — Progress Notes (Signed)
Gynecology STD Evaluation   hChief Complaint:  Chief Complaint  Patient presents with  . std check    History of Present Illness: Patient is a 54 y.o. [id number] presents for STD testing.  The patient has not noted any postmenopausal bleeding,  has not experienced postcoital bleeding, and does not report increased vaginal discharge.  There is not a history of prior sexually transmitted infection(s).     The patient is sexually active with one partner . She currently uses None for contraception. She never uses condoms or barrier methods to prevent the spread of sexually transmitted infections.  The patient has not been previously transfused or tattooed.    Review of Systems: 10 point review of systems negative unless otherwise noted in HPI  Past Medical History:  Past Medical History:  Diagnosis Date  . Family history of breast cancer    10/19 genetic testing letter sent  . GERD (gastroesophageal reflux disease)   . Hyperlipemia   . Hypertension   . Pre-diabetes   . Sleep apnea 10/02/2012   [facility] - Mild    Past Surgical History:  Past Surgical History:  Procedure Laterality Date  . CATARACT EXTRACTION W/PHACO Right 01/22/2017   Procedure: CATARACT EXTRACTION PHACO AND INTRAOCULAR LENS PLACEMENT (IOC)  Right Complicated;  Surgeon: Lockie Mola, MD;  Location: [facility];  Service: Ophthalmology;  Laterality: Right;   healon 5 vision blue  . CERVICAL POLYPECTOMY  2015  . COLONOSCOPY WITH PROPOFOL N/A 07/14/2017   Procedure: COLONOSCOPY WITH PROPOFOL;  Surgeon: Midge Minium, MD;  Location: [facility];  Service: Endoscopy;  Laterality: N/A;  sleep apnea    Gynecologic History: No LMP recorded. (Menstrual status: IUD).  Obstetric History: [id number]  Family History:  Family History  Problem Relation Age of Onset  . Breast cancer Sister 80  . Breast cancer Maternal Aunt 73    Social History:  Social History   Socioeconomic History  .  Marital status: Married    Spouse name: Not on file  . Number of children: Not on file  . Years of education: Not on file  . Highest education level: Not on file  Occupational History  . Not on file  Tobacco Use  . Smoking status: Never Smoker  . Smokeless tobacco: Never Used  Vaping Use  . Vaping Use: Never used  Substance and Sexual Activity  . Alcohol use: No    Comment: may have 1 glass wine/month  . Drug use: No  . Sexual activity: Yes    Birth control/protection: I.U.D.  Other Topics Concern  . Not on file  Social History Narrative  . Not on file   Social Determinants of Health   Financial Resource Strain:   . Difficulty of Paying Living Expenses:   Food Insecurity:   . Worried About Programme researcher, broadcasting/film/video in the Last Year:   . Barista in the Last Year:   Transportation Needs:   . Freight forwarder (Medical):   Marland Kitchen Lack of Transportation (Non-Medical):   Physical Activity:   . Days of Exercise per Week:   . Minutes of Exercise per Session:   Stress:   . Feeling of Stress :   Social Connections:   . Frequency of Communication with Friends and Family:   . Frequency of Social Gatherings with Friends and Family:   . Attends Religious Services:   . Active Member of Clubs or Organizations:   . Attends Banker Meetings:   .  Marital Status:   Intimate Partner Violence:   . Fear of Current or Ex-Partner:   . Emotionally Abused:   Marland Kitchen Physically Abused:   . Sexually Abused:     Allergies:  No Known Allergies  Medications: Prior to Admission medications   Medication Sig Start Date End Date Taking? Authorizing Provider  amLODipine (NORVASC) 5 MG tablet Take 5 mg by mouth daily.   Yes [provider]  fluticasone (FLONASE) 50 MCG/ACT nasal spray Place 2 sprays into both nostrils daily. 01/19/17  Yes Cook, Verdis Frederickson, DO  levonorgestrel (MIRENA) 20 MCG/24HR IUD 1 each by Intrauterine route once.   Yes [provider]    lisinopril-hydrochlorothiazide (PRINZIDE,ZESTORETIC) 20-12.5 MG tablet Take 1 tablet by mouth daily.   Yes [provider]  metoprolol (LOPRESSOR) 50 MG tablet Take 100 mg by mouth every morning.   Yes [provider]  phentermine 15 MG capsule Take 15 mg by mouth daily.   Yes [provider]  potassium chloride (K-DUR) 10 MEQ tablet Take 10 mEq by mouth daily.   Yes [provider]  pravastatin (PRAVACHOL) 40 MG tablet Take 40 mg by mouth daily.   Yes [provider]  topiramate (TOPAMAX) 50 MG tablet  03/03/17  Yes [provider]  valACYclovir (VALTREX) 1000 MG tablet Take by mouth. 04/30/16  Yes [provider]    Physical Exam Blood pressure 100/90, height 5\' 5"  (1.651 m), weight 243 lb (110.2 kg). No LMP recorded. (Menstrual status: IUD).  General: NAD HEENT: normocephalic, anicteric, there is some mild localized swelling in the left inferior eyelid Pulmonary: No increased work of breathing, CTAB Genitourinary:  External: Normal external female genitalia.  Normal urethral meatus, normal  Bartholin's and Skene's glands.    Vagina: Normal vaginal mucosa, no evidence of prolapse.    Cervix: Grossly normal in appearance, no bleeding  Uterus: Non-enlarged, mobile, normal contour.  No CMT  Adnexa: ovaries non-enlarged, no adnexal masses  Rectal: deferred  Lymphatic: no evidence of inguinal lymphadenopathy Extremities: no edema, erythema, or tenderness Neurologic: Grossly intact Psychiatric: mood appropriate, affect full  Female chaperone present for pelvic and breast  portions of the physical exam  Assessment: 54 y.o. 54  presenting for STD screeningpresenting for STD screening Plan: Problem List Items Addressed This Visit    None    Visit Diagnoses    Routine screening for STI (sexually transmitted infection)    -  Primary   Relevant Orders   Cervicovaginal ancillary only (Completed)   HEP, RPR, HIV Panel (Completed)      1) Full STI  screening was offered accepted  2) Annual in 3 months  3) Patient has noted swelling of the left eye lid.  Has remained stable  4) Return in about 3 months (around 06/22/2020) for annual.   06/24/2020, MD, Vena Austria [facility], [facility]

## 2020-03-23 LAB — HEP, RPR, HIV PANEL
HIV Screen 4th Generation wRfx: NONREACTIVE
Hepatitis B Surface Ag: NEGATIVE
RPR Ser Ql: NONREACTIVE

## 2020-03-24 LAB — CERVICOVAGINAL ANCILLARY ONLY
Chlamydia: NEGATIVE
Comment: NEGATIVE
Comment: NEGATIVE
Comment: NORMAL
Neisseria Gonorrhea: NEGATIVE
Trichomonas: NEGATIVE

## 2020-07-05 ENCOUNTER — Other Ambulatory Visit: Payer: Self-pay

## 2020-07-05 ENCOUNTER — Inpatient Hospital Stay
Admission: EM | Admit: 2020-07-05 | Discharge: 2020-07-08 | DRG: 872 | Disposition: A | Discharge status: Home / Self Care | Payer: BC Managed Care – PPO | Attending: Internal Medicine | Admitting: Internal Medicine

## 2020-07-05 ENCOUNTER — Emergency Department: Payer: BC Managed Care – PPO

## 2020-07-05 ENCOUNTER — Encounter: Payer: Self-pay | Admitting: Emergency Medicine

## 2020-07-05 DIAGNOSIS — N179 Acute kidney failure, unspecified: Secondary | ICD-10-CM | POA: Diagnosis not present

## 2020-07-05 DIAGNOSIS — R6520 Severe sepsis without septic shock: Secondary | ICD-10-CM | POA: Diagnosis not present

## 2020-07-05 DIAGNOSIS — Z6841 Body Mass Index (BMI) 40.0 and over, adult: Secondary | ICD-10-CM

## 2020-07-05 DIAGNOSIS — G4730 Sleep apnea, unspecified: Secondary | ICD-10-CM | POA: Diagnosis not present

## 2020-07-05 DIAGNOSIS — R7303 Prediabetes: Secondary | ICD-10-CM | POA: Diagnosis present

## 2020-07-05 DIAGNOSIS — R197 Diarrhea, unspecified: Secondary | ICD-10-CM | POA: Diagnosis not present

## 2020-07-05 DIAGNOSIS — Z9841 Cataract extraction status, right eye: Secondary | ICD-10-CM | POA: Diagnosis not present

## 2020-07-05 DIAGNOSIS — E871 Hypo-osmolality and hyponatremia: Secondary | ICD-10-CM | POA: Diagnosis present

## 2020-07-05 DIAGNOSIS — I10 Essential (primary) hypertension: Secondary | ICD-10-CM | POA: Diagnosis present

## 2020-07-05 DIAGNOSIS — E869 Volume depletion, unspecified: Secondary | ICD-10-CM | POA: Diagnosis not present

## 2020-07-05 DIAGNOSIS — B961 Klebsiella pneumoniae [K. pneumoniae] as the cause of diseases classified elsewhere: Secondary | ICD-10-CM | POA: Diagnosis not present

## 2020-07-05 DIAGNOSIS — Z961 Presence of intraocular lens: Secondary | ICD-10-CM | POA: Diagnosis present

## 2020-07-05 DIAGNOSIS — K219 Gastro-esophageal reflux disease without esophagitis: Secondary | ICD-10-CM | POA: Diagnosis present

## 2020-07-05 DIAGNOSIS — N136 Pyonephrosis: Secondary | ICD-10-CM | POA: Diagnosis present

## 2020-07-05 DIAGNOSIS — E785 Hyperlipidemia, unspecified: Secondary | ICD-10-CM | POA: Diagnosis present

## 2020-07-05 DIAGNOSIS — N12 Tubulo-interstitial nephritis, not specified as acute or chronic: Secondary | ICD-10-CM | POA: Diagnosis present

## 2020-07-05 DIAGNOSIS — E66813 Obesity, class 3: Secondary | ICD-10-CM | POA: Diagnosis present

## 2020-07-05 DIAGNOSIS — E876 Hypokalemia: Secondary | ICD-10-CM | POA: Diagnosis not present

## 2020-07-05 DIAGNOSIS — Z20822 Contact with and (suspected) exposure to covid-19: Secondary | ICD-10-CM | POA: Diagnosis present

## 2020-07-05 DIAGNOSIS — A4150 Gram-negative sepsis, unspecified: Secondary | ICD-10-CM | POA: Diagnosis not present

## 2020-07-05 DIAGNOSIS — Z79899 Other long term (current) drug therapy: Secondary | ICD-10-CM | POA: Diagnosis not present

## 2020-07-05 LAB — COMPREHENSIVE METABOLIC PANEL
ALT: 37 U/L (ref 0–44)
AST: 36 U/L (ref 15–41)
Albumin: 2.6 g/dL — ABNORMAL LOW (ref 3.5–5.0)
Alkaline Phosphatase: 114 U/L (ref 38–126)
Anion gap: 12 (ref 5–15)
BUN: 16 mg/dL (ref 6–20)
CO2: 29 mmol/L (ref 22–32)
Calcium: 8.5 mg/dL — ABNORMAL LOW (ref 8.9–10.3)
Chloride: 88 mmol/L — ABNORMAL LOW (ref 98–111)
Creatinine, Ser: 1.51 mg/dL — ABNORMAL HIGH (ref 0.44–1.00)
GFR, Estimated: 41 mL/min — ABNORMAL LOW (ref >60)
Glucose, Bld: 208 mg/dL — ABNORMAL HIGH (ref 70–99)
Potassium: 2.9 mmol/L — ABNORMAL LOW (ref 3.5–5.1)
Sodium: 129 mmol/L — ABNORMAL LOW (ref 135–145)
Total Bilirubin: 2.3 mg/dL — ABNORMAL HIGH (ref 0.3–1.2)
Total Protein: 7.7 g/dL (ref 6.5–8.1)

## 2020-07-05 LAB — CBC
HCT: 40 % (ref 36.0–46.0)
Hemoglobin: 13.2 g/dL (ref 12.0–15.0)
MCH: 25.6 pg — ABNORMAL LOW (ref 26.0–34.0)
MCHC: 33 g/dL (ref 30.0–36.0)
MCV: 77.7 fL — ABNORMAL LOW (ref 80.0–100.0)
Platelets: 287 10*3/uL (ref 150–400)
RBC: 5.15 MIL/uL — ABNORMAL HIGH (ref 3.87–5.11)
RDW: 14 % (ref 11.5–15.5)
WBC: 18.2 10*3/uL — ABNORMAL HIGH (ref 4.0–10.5)
nRBC: 0 % (ref 0.0–0.2)

## 2020-07-05 LAB — RESPIRATORY PANEL BY RT PCR (FLU A&B, COVID)
Influenza A by PCR: NEGATIVE
Influenza B by PCR: NEGATIVE
SARS Coronavirus 2 by RT PCR: NEGATIVE

## 2020-07-05 LAB — URINALYSIS, COMPLETE (UACMP) WITH MICROSCOPIC
Bilirubin Urine: NEGATIVE
Glucose, UA: NEGATIVE mg/dL
Ketones, ur: NEGATIVE mg/dL
Nitrite: POSITIVE — AB
Protein, ur: 30 mg/dL — AB
Specific Gravity, Urine: 1.006 (ref 1.005–1.030)
WBC, UA: >50 WBC/hpf — ABNORMAL HIGH (ref 0–5)
pH: 6 (ref 5.0–8.0)

## 2020-07-05 LAB — LACTIC ACID, PLASMA
Lactic Acid, Venous: 1.3 mmol/L (ref 0.5–1.9)
Lactic Acid, Venous: 1.3 mmol/L (ref 0.5–1.9)

## 2020-07-05 LAB — HIV ANTIBODY (ROUTINE TESTING W REFLEX): HIV Screen 4th Generation wRfx: NONREACTIVE

## 2020-07-05 LAB — LIPASE, BLOOD: Lipase: 25 U/L (ref 11–51)

## 2020-07-05 LAB — MAGNESIUM: Magnesium: 2.3 mg/dL (ref 1.7–2.4)

## 2020-07-05 MED ORDER — IOHEXOL 300 MG/ML  SOLN
75.0000 mL | Freq: Once | INTRAMUSCULAR | Status: AC | PRN
  Administered 2020-07-05: 75 mL via INTRAVENOUS

## 2020-07-05 MED ORDER — ONDANSETRON HCL 4 MG/2ML IJ SOLN
4.0000 mg | Freq: Once | INTRAMUSCULAR | Status: AC
  Administered 2020-07-05: 4 mg via INTRAVENOUS
  Filled 2020-07-05: qty 2

## 2020-07-05 MED ORDER — FLUTICASONE PROPIONATE 50 MCG/ACT NA SUSP
2.0000 | Freq: Every day | NASAL | Status: DC
  Administered 2020-07-06 – 2020-07-08 (×3): 2 via NASAL
  Filled 2020-07-05: qty 16

## 2020-07-05 MED ORDER — METOPROLOL TARTRATE 50 MG PO TABS
100.0000 mg | ORAL_TABLET | ORAL | Status: DC
  Administered 2020-07-05 – 2020-07-08 (×4): 100 mg via ORAL
  Filled 2020-07-05 (×2): qty 2
  Filled 2020-07-05: qty 4
  Filled 2020-07-05: qty 2

## 2020-07-05 MED ORDER — SODIUM CHLORIDE 0.9 % IV SOLN
2.0000 g | Freq: Once | INTRAVENOUS | Status: AC
  Administered 2020-07-05: 2 g via INTRAVENOUS
  Filled 2020-07-05: qty 20

## 2020-07-05 MED ORDER — METOPROLOL TARTRATE 5 MG/5ML IV SOLN: 5.0000 mg | Freq: Three times a day (TID) | INTRAVENOUS | Status: DC | PRN

## 2020-07-05 MED ORDER — LACTATED RINGERS IV SOLN: INTRAVENOUS | Status: AC

## 2020-07-05 MED ORDER — ACETAMINOPHEN 500 MG PO TABS
1000.0000 mg | ORAL_TABLET | Freq: Four times a day (QID) | ORAL | Status: DC | PRN
  Administered 2020-07-05 – 2020-07-06 (×2): 1000 mg via ORAL
  Filled 2020-07-05 (×2): qty 2

## 2020-07-05 MED ORDER — POTASSIUM CHLORIDE 10 MEQ/100ML IV SOLN
10.0000 meq | INTRAVENOUS | Status: AC
  Administered 2020-07-05 (×4): 10 meq via INTRAVENOUS
  Filled 2020-07-05 (×4): qty 100

## 2020-07-05 MED ORDER — SODIUM CHLORIDE 0.9 % IV SOLN
1.0000 g | INTRAVENOUS | Status: DC
  Filled 2020-07-05: qty 10

## 2020-07-05 MED ORDER — SODIUM CHLORIDE 0.9 % IV SOLN
1000.0000 mL | Freq: Once | INTRAVENOUS | Status: AC
  Administered 2020-07-05: 1000 mL via INTRAVENOUS

## 2020-07-05 MED ORDER — ONDANSETRON HCL 4 MG/2ML IJ SOLN: 4.0000 mg | Freq: Three times a day (TID) | INTRAMUSCULAR | Status: AC | PRN

## 2020-07-05 MED ORDER — METOPROLOL TARTRATE 50 MG PO TABS: 100.0000 mg | ORAL_TABLET | ORAL | Status: DC

## 2020-07-05 MED ORDER — PRAVASTATIN SODIUM 20 MG PO TABS
40.0000 mg | ORAL_TABLET | Freq: Every day | ORAL | Status: DC
  Administered 2020-07-06 – 2020-07-08 (×3): 40 mg via ORAL
  Filled 2020-07-05 (×3): qty 2

## 2020-07-05 MED ORDER — AMLODIPINE BESYLATE 5 MG PO TABS
5.0000 mg | ORAL_TABLET | Freq: Every day | ORAL | Status: DC
  Administered 2020-07-05 – 2020-07-06 (×2): 5 mg via ORAL
  Filled 2020-07-05 (×2): qty 1

## 2020-07-05 MED ORDER — ENOXAPARIN SODIUM 80 MG/0.8ML ~~LOC~~ SOLN
0.5000 mg/kg | SUBCUTANEOUS | Status: DC
  Administered 2020-07-06 – 2020-07-07 (×2): 62.5 mg via SUBCUTANEOUS
  Filled 2020-07-05 (×4): qty 0.8

## 2020-07-05 MED ORDER — POTASSIUM CHLORIDE CRYS ER 10 MEQ PO TBCR
10.0000 meq | EXTENDED_RELEASE_TABLET | Freq: Every day | ORAL | Status: DC
  Administered 2020-07-05: 10 meq via ORAL
  Filled 2020-07-05 (×2): qty 1

## 2020-07-05 NOTE — ED Triage Notes (Signed)
Pt comes into the ED via POV c/o right lower abd pain, emesis, diarrhea, and left lower back pain.  Pt in NAD at this time with even and unlabored respirations.  Pt was concerned it may have been food poisoning from eating out.

## 2020-07-05 NOTE — ED Provider Notes (Signed)
[facility] Emergency Department Provider Note   ____________________________________________    I have reviewed the triage vital signs and the nursing notes.   HISTORY  Chief Complaint Emesis and Abdominal Pain     HPI Jennifer Mueller is a 54 y.o. female with a history of prediabetes who presents with complaints of bilateral abdominal pain, nausea vomiting, some loose stools.  She reports this is been ongoing for several days.  She thought she was getting better but worsened again last night.  Describes cramping pain in her flanks bilaterally.  No dysuria, no hematuria reported.  Patient has had 2 C-sections, no other abdominal surgeries.  No fevers or chills reported.  No sick contacts.  Past Medical History:  Diagnosis Date  . Family history of breast cancer    10/19 genetic testing letter sent  . GERD (gastroesophageal reflux disease)   . Hyperlipemia   . Hypertension   . Pre-diabetes   . Sleep apnea 10/02/2012   [facility] - Mild    Patient Active Problem List   Diagnosis Date Noted  . Pyelonephritis 07/05/2020  . Special screening for malignant neoplasms, colon     Past Surgical History:  Procedure Laterality Date  . CATARACT EXTRACTION W/PHACO Right 01/22/2017   Procedure: CATARACT EXTRACTION PHACO AND INTRAOCULAR LENS PLACEMENT (IOC)  Right Complicated;  Surgeon: Lockie Mola, MD;  Location: [facility];  Service: Ophthalmology;  Laterality: Right;   healon 5 vision blue  . CERVICAL POLYPECTOMY  2015  . COLONOSCOPY WITH PROPOFOL N/A 07/14/2017   Procedure: COLONOSCOPY WITH PROPOFOL;  Surgeon: Midge Minium, MD;  Location: [facility];  Service: Endoscopy;  Laterality: N/A;  sleep apnea    Prior to Admission medications   Medication Sig Start Date End Date Taking? Authorizing Provider  amLODipine (NORVASC) 5 MG tablet Take 5 mg by mouth daily.    [provider]  fluticasone (FLONASE) 50 MCG/ACT  nasal spray Place 2 sprays into both nostrils daily. 01/19/17   Tommie Sams, DO  levonorgestrel (MIRENA) 20 MCG/24HR IUD 1 each by Intrauterine route once.    [provider]  lisinopril-hydrochlorothiazide (PRINZIDE,ZESTORETIC) 20-12.5 MG tablet Take 1 tablet by mouth daily.    [provider]  metoprolol (LOPRESSOR) 50 MG tablet Take 100 mg by mouth every morning.    [provider]  phentermine 15 MG capsule Take 15 mg by mouth daily.    [provider]  potassium chloride (K-DUR) 10 MEQ tablet Take 10 mEq by mouth daily.    [provider]  pravastatin (PRAVACHOL) 40 MG tablet Take 40 mg by mouth daily.    [provider]  topiramate (TOPAMAX) 50 MG tablet  03/03/17   [provider]  valACYclovir (VALTREX) 1000 MG tablet Take by mouth. 04/30/16   [provider]     Allergies Patient has no known allergies.  Family History  Problem Relation Age of Onset  . Breast cancer Sister 57  . Breast cancer Maternal Aunt 37    Social History Social History   Tobacco Use  . Smoking status: Never Smoker  . Smokeless tobacco: Never Used  Vaping Use  . Vaping Use: Never used  Substance Use Topics  . Alcohol use: No    Comment: may have 1 glass wine/month  . Drug use: No    Review of Systems  Constitutional: No fever/chills Eyes: No visual changes.  ENT: No sore throat. Cardiovascular: Denies chest pain. Respiratory: Denies shortness  of breath. Gastrointestinal: As above Genitourinary: As above Musculoskeletal: Negative for back pain. Skin: Negative for rash. Neurological: Negative for headaches or weakness   ____________________________________________   PHYSICAL EXAM:  VITAL SIGNS: ED Triage Vitals [07/05/20 0834]  Enc Vitals Group     BP (!) 154/96     Pulse Rate (!) 121     Resp 18     Temp 98.5 F (36.9 C)     Temp Source Oral     SpO2 96 %     Weight 124.7 kg (275 lb)     Height 1.651 m  (5\' 5" )     Head Circumference      Peak Flow      Pain Score 10     Pain Loc      Pain Edu?      Excl. in GC?     Constitutional: Alert and oriented.  Nose: No congestion/rhinnorhea. Mouth/Throat: Mucous membranes are moist.   Neck:  Painless ROM Cardiovascular: Tachycardia, regular rhythm. Grossly normal heart sounds.  Good peripheral circulation. Respiratory: Normal respiratory effort.  No retractions. Lungs CTAB. Gastrointestinal: Mild left lower quadrant tenderness, no distention.  Mild  Musculoskeletal: No lower extremity tenderness nor edema.  Warm and well perfused Neurologic:  Normal speech and language. No gross focal neurologic deficits are appreciated.  Skin:  Skin is warm, dry and intact. No rash noted. Psychiatric: Mood and affect are normal. Speech and behavior are normal.  ____________________________________________   LABS (all labs ordered are listed, but only abnormal results are displayed)  Labs Reviewed  COMPREHENSIVE METABOLIC PANEL - Abnormal; Notable for the following components:      Result Value   Sodium 129 (*)    Potassium 2.9 (*)    Chloride 88 (*)    Glucose, Bld 208 (*)    Creatinine, Ser 1.51 (*)    Calcium 8.5 (*)    Albumin 2.6 (*)    Total Bilirubin 2.3 (*)    GFR, Estimated 41 (*)    All other components within normal limits  CBC - Abnormal; Notable for the following components:   WBC 18.2 (*)    RBC 5.15 (*)    MCV 77.7 (*)    MCH 25.6 (*)    All other components within normal limits  URINALYSIS, COMPLETE (UACMP) WITH MICROSCOPIC - Abnormal; Notable for the following components:   Color, Urine YELLOW (*)    APPearance CLOUDY (*)    Hgb urine dipstick LARGE (*)    Protein, ur 30 (*)    Nitrite POSITIVE (*)    Leukocytes,Ua LARGE (*)    WBC, UA >50 (*)    Bacteria, UA MANY (*)    All other components within normal limits  CULTURE, BLOOD (ROUTINE X 2)  CULTURE, BLOOD (ROUTINE X 2)  RESPIRATORY PANEL BY RT PCR (FLU A&B, COVID)   LIPASE, BLOOD  LACTIC ACID, PLASMA  LACTIC ACID, PLASMA  HIV ANTIBODY (ROUTINE TESTING W REFLEX)  MAGNESIUM  POC URINE PREG, ED   ____________________________________________  EKG  None ____________________________________________  RADIOLOGY  CT abdomen pelvis, reviewed by me, no ureteral stones ____________________________________________   PROCEDURES  Procedure(s) performed: No  Procedures   Critical Care performed: No ____________________________________________   INITIAL IMPRESSION / ASSESSMENT AND PLAN / ED COURSE  Pertinent labs & imaging results that were available during my care of the patient were reviewed by me and considered in my medical decision making (see chart for details).  Patient presents with nausea vomiting,  abdominal pain as above.  Noted to be tachycardic upon arrival.  Suspicious for viral gastroenteritis, possibly diverticulitis, possible UTI.  Will treat with IV fluids, IV Zofran, IV morphine   Lab work notable for elevated white blood cell count of 18 as well as urinalysis consistent with urinary tract infection.,  Will start IV Rocephin  Sent for CT abdomen pelvis given tenderness to palpation and CVA tenderness.  CT noted by radiology to demonstrate bilateral a sending urinary tract infection signs.  Given prediabetes, tachycardia, elevated white blood cell count, will admit for continued management      ____________________________________________   FINAL CLINICAL IMPRESSION(S) / ED DIAGNOSES  Final diagnoses:  Pyelonephritis        Note:  This document was prepared using Dragon voice recognition software and may include unintentional dictation errors.   Jene Every, MD 07/05/20 [phone]

## 2020-07-05 NOTE — Progress Notes (Signed)
Report called to [facility] RN. Patient transferred to 227.

## 2020-07-05 NOTE — Progress Notes (Signed)
PHARMACIST - PHYSICIAN COMMUNICATION  CONCERNING:  Enoxaparin (Lovenox) for DVT Prophylaxis    RECOMMENDATION: Patient was prescribed enoxaprin 40mg  q24 hours for VTE prophylaxis.   Filed Weights   07/05/20 0834  Weight: 124.7 kg (275 lb)    Body mass index is 45.76 kg/m.  Estimated Creatinine Clearance: 57.2 mL/min (A) (by C-G formula based on SCr of 1.51 mg/dL (H)).   Based on [facility] policy patient is candidate for enoxaparin 0.5mg /kg TBW SQ every 24 hours based on BMI being >30.  DESCRIPTION: Pharmacy has adjusted enoxaparin dose per [facility] policy.  Patient is now receiving enoxaparin 62.5 mg every 24 hours    [facility], PharmD, BCPS Clinical Pharmacist 07/05/2020 1:35 PM

## 2020-07-05 NOTE — Progress Notes (Signed)
Patient transferred to Room 37

## 2020-07-05 NOTE — Progress Notes (Addendum)
Sepsis screen performed and some signs of sepsis noted. Pt being treated accordingly. Will continue to monitor.

## 2020-07-05 NOTE — H&P (Signed)
History and Physical   TAKIMA ENCINA LFY:[id number] DOB: [birth year] DOA: 07/05/2020  PCP: Cathie Hoops, PA  Patient coming from: Home  I have personally briefly reviewed patient's old medical records in [facility] EMR.  Chief Concern: Abdominal pain and vomiting  HPI: MEDRITH VEILLON is a 54 y.o. female with medical history significant for hypertension, hyperlipidemia, migraine headaches, intrauterine device, obesity, presented to the emergency department for chief concern of abdominal pain, nausea, and vomiting.  She states that last Friday, 06/30/2020, she ate chili and several hours later developed persistent diarrhea.  She was unable to leave the restroom and requested her boss that she needed to go home.  She works as a Scientist, research (life sciences).  She continued to have generalized weakness throughout the weekend.  Prior to presentation she developed nausea and vomiting and suprapubic abdominal pain.  She states she has never had this before.  She reports the pain is achy.  She denies fever, chills, chest pain, shortness of breath, back pain, vision changes, headache.  She denies dysuria and hematuria.    Social history: Lives at home with her son.  Denies tobacco, EtOH, recreational drug use.  ED Course: Discussed with ED provider, admit for complicated urinary tract infection.  Review of Systems: As per HPI otherwise 10 point review of systems negative.  Labs obtained in the ED showed sodium 129, potassium 2.9, chloride 88, glucose 208, creatinine 1.51  WBC 18.2, H&H is normal, platelets 287. UA showed positive for leukocytes and nitrates.  Normal squamous epithelial.  She received ceftriaxone 2 g IV in the ED, Zofran 4 mg IV, 1 L normal saline bolus.  Past Medical History:  Diagnosis Date   Family history of breast cancer    10/19 genetic testing letter sent   GERD (gastroesophageal reflux disease)    Hyperlipemia    Hypertension    Pre-diabetes    Sleep apnea  10/02/2012   [facility] - Mild   Past Surgical History:  Procedure Laterality Date   CATARACT EXTRACTION W/PHACO Right 01/22/2017   Procedure: CATARACT EXTRACTION PHACO AND INTRAOCULAR LENS PLACEMENT (IOC)  Right Complicated;  Surgeon: Lockie Mola, MD;  Location: [facility];  Service: Ophthalmology;  Laterality: Right;   healon 5 vision blue   CERVICAL POLYPECTOMY  2015   COLONOSCOPY WITH PROPOFOL N/A 07/14/2017   Procedure: COLONOSCOPY WITH PROPOFOL;  Surgeon: Midge Minium, MD;  Location: [facility];  Service: Endoscopy;  Laterality: N/A;  sleep apnea   Social History:  reports that she has never smoked. She has never used smokeless tobacco. She reports that she does not drink alcohol and does not use drugs.  No Known Allergies  Family History  Problem Relation Age of Onset   Breast cancer Sister 61   Breast cancer Maternal Aunt 56   Family history: Family history reviewed and not pertinent to emesis and UTI.  Prior to Admission medications   Medication Sig Start Date End Date Taking? Authorizing Provider  amLODipine (NORVASC) 5 MG tablet Take 5 mg by mouth daily.    [provider]  fluticasone (FLONASE) 50 MCG/ACT nasal spray Place 2 sprays into both nostrils daily. 01/19/17   Tommie Sams, DO  levonorgestrel (MIRENA) 20 MCG/24HR IUD 1 each by Intrauterine route once.    [provider]  lisinopril-hydrochlorothiazide (PRINZIDE,ZESTORETIC) 20-12.5 MG tablet Take 1 tablet by mouth daily.    [provider]  metoprolol (LOPRESSOR) 50 MG tablet Take 100 mg by mouth every morning.  [provider]  phentermine 15 MG capsule Take 15 mg by mouth daily.    [provider]  potassium chloride (K-DUR) 10 MEQ tablet Take 10 mEq by mouth daily.    [provider]  pravastatin (PRAVACHOL) 40 MG tablet Take 40 mg by mouth daily.    [provider]  topiramate (TOPAMAX) 50 MG tablet  03/03/17    [provider]  valACYclovir (VALTREX) 1000 MG tablet Take by mouth. 04/30/16   [provider]   Physical Exam: Vitals:   07/05/20 1300 07/05/20 1523 07/05/20 1600 07/05/20 1800  BP: (!) 173/88 (!) 157/99  (!) 174/99  Pulse: (!) 118   (!) 117  Resp: 19  (!) 26 (!) 21  Temp: 99.1 F (37.3 C)   100.1 F (37.8 C)  TempSrc: Oral   Oral  SpO2: 96%   100%  Weight:      Height:       Constitutional: NAD, calm, comfortable Eyes: PERRL, lids and conjunctivae normal ENMT: Mucous membranes are moist. Posterior pharynx clear of any exudate or lesions.Normal dentition.  Neck: normal, supple, no masses, no thyromegaly Respiratory: clear to auscultation bilaterally, no wheezing, no crackles. Normal respiratory effort. No accessory muscle use.  Cardiovascular: Regular rate and rhythm, no murmurs / rubs / gallops. No extremity edema. 2+ pedal pulses. No carotid bruits.  Abdomen: obese abdomen, no tenderness, no masses palpated. No hepatosplenomegaly. Bowel sounds positive.  Musculoskeletal: no clubbing / cyanosis. No joint deformity upper and lower extremities. Good ROM, no contractures. Normal muscle tone.  Skin: no rashes, lesions, ulcers. No induration Neurologic: Sensation intact. Strength 5/5 in all 4.  Psychiatric: Normal judgment and insight. Alert and oriented x 3. Normal mood.   Labs on Admission: I have personally reviewed following labs and imaging studies  CBC: Recent Labs  Lab 07/05/20 0839  WBC 18.2*  HGB 13.2  HCT 40.0  MCV 77.7*  PLT 287   Basic Metabolic Panel: Recent Labs  Lab 07/05/20 0839 07/05/20 1812  NA 129*  --   K 2.9*  --   CL 88*  --   CO2 29  --   GLUCOSE 208*  --   BUN 16  --   CREATININE 1.51*  --   CALCIUM 8.5*  --   MG  --  2.3   GFR: Estimated Creatinine Clearance: 57.2 mL/min (A) (by C-G formula based on SCr of 1.51 mg/dL (H)).  Liver Function Tests: Recent Labs  Lab 07/05/20 0839  AST 36  ALT 37  ALKPHOS 114    BILITOT 2.3*  PROT 7.7  ALBUMIN 2.6*   Recent Labs  Lab 07/05/20 0839  LIPASE 25   Urine analysis:    Component Value Date/Time   COLORURINE YELLOW (A) 07/05/2020 0839   APPEARANCEUR CLOUDY (A) 07/05/2020 0839   LABSPEC 1.006 07/05/2020 0839   PHURINE 6.0 07/05/2020 0839   GLUCOSEU NEGATIVE 07/05/2020 0839   HGBUR LARGE (A) 07/05/2020 0839   BILIRUBINUR NEGATIVE 07/05/2020 0839   KETONESUR NEGATIVE 07/05/2020 0839   PROTEINUR 30 (A) 07/05/2020 0839   NITRITE POSITIVE (A) 07/05/2020 0839   LEUKOCYTESUR LARGE (A) 07/05/2020 0839   Radiological Exams on Admission: Personally reviewed and I agree with radiologist reading as below.  CT ABDOMEN PELVIS W CONTRAST  Result Date: 07/05/2020 CLINICAL DATA:  Elevated WBC, abdominal pain EXAM: CT ABDOMEN AND PELVIS WITH CONTRAST TECHNIQUE: Multidetector CT imaging of the abdomen and pelvis was performed using the standard protocol following bolus administration  of intravenous contrast. CONTRAST:  66mL OMNIPAQUE IOHEXOL 300 MG/ML  SOLN COMPARISON:  None. FINDINGS: Lower chest: Mild bibasilar atelectasis. Hepatobiliary: No focal liver abnormality is seen. No gallstones, gallbladder wall thickening, or biliary dilatation. Pancreas: Unremarkable. Spleen: Unremarkable. Adrenals/Urinary Tract: Adrenals are unremarkable. There is bilateral perinephric and periureteric stranding. Mild bilateral hydroureteronephrosis. There is urothelial enhancement the ureters and renal collecting systems. Bladder is unremarkable. Stomach/Bowel: Small hiatal hernia.  Bowel is normal in caliber. Vascular/Lymphatic: No significant vascular findings are present. No enlarged lymph nodes identified. Reproductive: Intrauterine device is present.  No adnexal mass. Other: No ascites.  No acute abnormality of the abdominal wall. Musculoskeletal: Lower lumbar spine degenerative changes. IMPRESSION: Findings suspicious for bilateral ascending urinary tract infection. No obstructing  stone. Electronically Signed   By: Guadlupe Spanish M.D.   On: 07/05/2020 12:18   EKG: EKG not indicated at this time  Assessment/Plan  Active Problems:   Pyelonephritis   Essential hypertension   Hyperlipidemia   Obesity, Class III, BMI 40-49.9 (morbid obesity) (HCC)   Leukocytosis-suspect secondary to ascending urinary tract infection Pyelonephritis-in setting of recent diarrhea -Ceftriaxone IV  Emesis-improved with Zofran, Zofran IV 4 mg every 8 hours as needed Diarrhea-etiology unclear at this time, suspect diet related, C. difficile check, GI panel  Abnormal electrolyte-suspect secondary to GI losses including vomiting and diarrhea -IVF with LR's 150 cc/h for 2 days BMP in the a.m.  Hypertension-elevated patient did not take her blood pressure medicine this morning -Amlodipine 5 mg daily -Metoprolol tartrate 100 mg p.o. daily  Sinus tachycardia-suspect secondary to pyelonephritis -Metoprolol tartrate injection 5 mg every 8 hours as needed for elevated blood pressure and elevated heart rate greater than 120  Hyperlipidemia-pravastatin 40 mg nightly  Class III obesity-recommend primary care provider to refer patient to weight loss surgery as preventative measure for metabolic syndrome and other development of comorbidities  DVT prophylaxis: Enoxaparin Code Status: Full code Diet: Heart healthy/carb Family Communication: None at this time Disposition Plan: Pending clinical course Consults called: Not indicated at this time Admission status: Observation  Armentha Branagan N Adelisa Satterwhite D.O. [facility]  If 7AM-7PM, please contact day-coverage provider [email or web address]  07/05/2020, 8:23 PM

## 2020-07-05 NOTE — Progress Notes (Signed)
   07/05/20 1800  Assess: MEWS Score  Temp 100.1 F (37.8 C)  BP (!) 174/99  Pulse Rate (!) 117  Resp (!) 21  SpO2 100 %  O2 Device Room Air  Assess: MEWS Score  MEWS Temp 0  MEWS Systolic 0  MEWS Pulse 2  MEWS RR 1  MEWS LOC 0  MEWS Score 3  MEWS Score Color Yellow  Assess: if the MEWS score is Yellow or Red  Were vital signs taken at a resting state? Yes  Focused Assessment No change from prior assessment  Early Detection of Sepsis Score *See Row Information* Low  MEWS guidelines implemented *See Row Information* Yes  Treat  MEWS Interventions Administered scheduled meds/treatments  Pain Scale 0-10  Pain Score 0  Take Vital Signs  Increase Vital Sign Frequency  Yellow: Q 2hr X 2 then Q 4hr X 2, if remains yellow, continue Q 4hrs  Escalate  MEWS: Escalate Yellow: discuss with charge nurse/RN and consider discussing with provider and RRT  Notify: Charge Nurse/RN  Name of Charge Nurse/RN Notified Erika, RN  Date Charge Nurse/RN Notified 07/05/20  Time Charge Nurse/RN Notified 1845  Notify: Provider  Provider Name/Title Amy Cox  Date Provider Notified 07/05/20  Time Provider Notified 1845  Notification Type Page  Notification Reason Change in status  Response Other (Comment) (awiting orders)  Document  Patient Outcome Stabilized after interventions  Progress note created (see row info) Yes  Pt to unit at 632pm, with [place], RN as a verifier that patient was not in the room when prior note was made. Pt yellow MEWS at this time. MD Cox notified via messenger and awaiting response. Tele monitor [id number], ST at this time. Pt is AxOx4. No distress noted of patient at this time. Last BM per patient this morning, 07-05-20. Pt educated about enteric precautions and stool specimen at this time. Metoprolol 100mg  po given. Will continue to monitor closely.

## 2020-07-05 NOTE — ED Notes (Signed)
Pt currently in CT.

## 2020-07-05 NOTE — Progress Notes (Signed)
Per MD note patient to start IVF and IV ABT. HR 120's and patient takes Metoprolol at home. Notified MD that no orders are in for above mentioned.

## 2020-07-06 DIAGNOSIS — E876 Hypokalemia: Secondary | ICD-10-CM | POA: Diagnosis not present

## 2020-07-06 DIAGNOSIS — I10 Essential (primary) hypertension: Secondary | ICD-10-CM

## 2020-07-06 DIAGNOSIS — Z6841 Body Mass Index (BMI) 40.0 and over, adult: Secondary | ICD-10-CM | POA: Diagnosis not present

## 2020-07-06 DIAGNOSIS — R6520 Severe sepsis without septic shock: Secondary | ICD-10-CM

## 2020-07-06 DIAGNOSIS — Z20822 Contact with and (suspected) exposure to covid-19: Secondary | ICD-10-CM | POA: Diagnosis present

## 2020-07-06 DIAGNOSIS — N12 Tubulo-interstitial nephritis, not specified as acute or chronic: Secondary | ICD-10-CM

## 2020-07-06 DIAGNOSIS — E869 Volume depletion, unspecified: Secondary | ICD-10-CM | POA: Diagnosis present

## 2020-07-06 DIAGNOSIS — R197 Diarrhea, unspecified: Secondary | ICD-10-CM | POA: Diagnosis present

## 2020-07-06 DIAGNOSIS — K219 Gastro-esophageal reflux disease without esophagitis: Secondary | ICD-10-CM | POA: Diagnosis present

## 2020-07-06 DIAGNOSIS — R7303 Prediabetes: Secondary | ICD-10-CM | POA: Diagnosis present

## 2020-07-06 DIAGNOSIS — N136 Pyonephrosis: Secondary | ICD-10-CM | POA: Diagnosis present

## 2020-07-06 DIAGNOSIS — G4730 Sleep apnea, unspecified: Secondary | ICD-10-CM | POA: Diagnosis present

## 2020-07-06 DIAGNOSIS — E871 Hypo-osmolality and hyponatremia: Secondary | ICD-10-CM | POA: Diagnosis present

## 2020-07-06 DIAGNOSIS — Z961 Presence of intraocular lens: Secondary | ICD-10-CM | POA: Diagnosis present

## 2020-07-06 DIAGNOSIS — Z79899 Other long term (current) drug therapy: Secondary | ICD-10-CM | POA: Diagnosis not present

## 2020-07-06 DIAGNOSIS — E785 Hyperlipidemia, unspecified: Secondary | ICD-10-CM | POA: Diagnosis present

## 2020-07-06 DIAGNOSIS — B961 Klebsiella pneumoniae [K. pneumoniae] as the cause of diseases classified elsewhere: Secondary | ICD-10-CM | POA: Diagnosis present

## 2020-07-06 DIAGNOSIS — N179 Acute kidney failure, unspecified: Secondary | ICD-10-CM | POA: Diagnosis present

## 2020-07-06 DIAGNOSIS — A4150 Gram-negative sepsis, unspecified: Principal | ICD-10-CM

## 2020-07-06 DIAGNOSIS — Z9841 Cataract extraction status, right eye: Secondary | ICD-10-CM | POA: Diagnosis not present

## 2020-07-06 LAB — BLOOD CULTURE ID PANEL (REFLEXED) - BCID2

## 2020-07-06 LAB — BASIC METABOLIC PANEL
Anion gap: 9 (ref 5–15)
BUN: 16 mg/dL (ref 6–20)
CO2: 31 mmol/L (ref 22–32)
Calcium: 8.2 mg/dL — ABNORMAL LOW (ref 8.9–10.3)
Chloride: 93 mmol/L — ABNORMAL LOW (ref 98–111)
Creatinine, Ser: 1.17 mg/dL — ABNORMAL HIGH (ref 0.44–1.00)
GFR, Estimated: 56 mL/min — ABNORMAL LOW (ref >60)
Glucose, Bld: 101 mg/dL — ABNORMAL HIGH (ref 70–99)
Potassium: 3.3 mmol/L — ABNORMAL LOW (ref 3.5–5.1)
Sodium: 133 mmol/L — ABNORMAL LOW (ref 135–145)

## 2020-07-06 LAB — CBC
HCT: 36.7 % (ref 36.0–46.0)
Hemoglobin: 12.1 g/dL (ref 12.0–15.0)
MCH: 25.7 pg — ABNORMAL LOW (ref 26.0–34.0)
MCHC: 33 g/dL (ref 30.0–36.0)
MCV: 77.9 fL — ABNORMAL LOW (ref 80.0–100.0)
Platelets: 263 10*3/uL (ref 150–400)
RBC: 4.71 MIL/uL (ref 3.87–5.11)
RDW: 14.3 % (ref 11.5–15.5)
WBC: 14.7 10*3/uL — ABNORMAL HIGH (ref 4.0–10.5)
nRBC: 0 % (ref 0.0–0.2)

## 2020-07-06 LAB — LACTIC ACID, PLASMA: Lactic Acid, Venous: 1.1 mmol/L (ref 0.5–1.9)

## 2020-07-06 MED ORDER — AMLODIPINE BESYLATE 5 MG PO TABS
5.0000 mg | ORAL_TABLET | Freq: Once | ORAL | Status: AC
  Administered 2020-07-06: 5 mg via ORAL
  Filled 2020-07-06: qty 1

## 2020-07-06 MED ORDER — SODIUM CHLORIDE 0.9 % IV BOLUS (SEPSIS): 250.0000 mL | Freq: Once | INTRAVENOUS | Status: DC

## 2020-07-06 MED ORDER — SODIUM CHLORIDE 0.9 % IV SOLN: 1.0000 g | INTRAVENOUS | Status: DC

## 2020-07-06 MED ORDER — SODIUM CHLORIDE 0.9 % IV SOLN
2.0000 g | INTRAVENOUS | Status: DC
  Administered 2020-07-06 – 2020-07-07 (×2): 2 g via INTRAVENOUS
  Filled 2020-07-06: qty 2
  Filled 2020-07-06 (×2): qty 20

## 2020-07-06 MED ORDER — POTASSIUM CHLORIDE CRYS ER 20 MEQ PO TBCR
40.0000 meq | EXTENDED_RELEASE_TABLET | Freq: Once | ORAL | Status: AC
Start: 2020-07-06 — End: 2020-07-06
  Administered 2020-07-06: 40 meq via ORAL
  Filled 2020-07-06: qty 2

## 2020-07-06 MED ORDER — HYDRALAZINE HCL 20 MG/ML IJ SOLN
10.0000 mg | INTRAMUSCULAR | Status: DC | PRN
  Administered 2020-07-07: 10 mg via INTRAVENOUS
  Filled 2020-07-06: qty 1

## 2020-07-06 MED ORDER — AMLODIPINE BESYLATE 10 MG PO TABS
10.0000 mg | ORAL_TABLET | Freq: Every day | ORAL | Status: DC
  Administered 2020-07-07 – 2020-07-08 (×2): 10 mg via ORAL
  Filled 2020-07-06 (×2): qty 1

## 2020-07-06 MED ORDER — ACETAMINOPHEN 325 MG PO TABS: 650.0000 mg | ORAL_TABLET | Freq: Four times a day (QID) | ORAL | Status: DC | PRN

## 2020-07-06 NOTE — Progress Notes (Signed)
   07/05/20 2038  Assess: MEWS Score  Temp (!) 101.8 F (38.8 C)  BP (!) 141/90  Pulse Rate 91  Resp 20  Level of Consciousness Alert  SpO2 94 %  O2 Device Room Air  Assess: MEWS Score  MEWS Temp 2  MEWS Systolic 0  MEWS Pulse 0  MEWS RR 0  MEWS LOC 0  MEWS Score 2  MEWS Score Color Yellow  Assess: if the MEWS score is Yellow or Red  Were vital signs taken at a resting state? Yes  Focused Assessment No change from prior assessment  Early Detection of Sepsis Score *See Row Information* Low  MEWS guidelines implemented *See Row Information* Yes  Treat  MEWS Interventions Other (Comment)  Pain Scale 0-10  Pain Score 0  Take Vital Signs  Increase Vital Sign Frequency  Yellow: Q 2hr X 2 then Q 4hr X 2, if remains yellow, continue Q 4hrs  Escalate  MEWS: Escalate Yellow: discuss with charge nurse/RN and consider discussing with provider and RRT  Notify: Charge Nurse/RN  Name of Charge Nurse/RN Notified Christal, RN  Date Charge Nurse/RN Notified 07/05/20  Time Charge Nurse/RN Notified 2100  Notify: Provider  Provider Name/Title Manuela Schwartz  Date Provider Notified 07/05/20  Time Provider Notified 2040  Notification Type Page  Notification Reason Other (Comment) (continued Yellow news )  Response See new orders  Date of Provider Response 07/05/20  Time of Provider Response 2100  Document  Patient Outcome Not stable and remains on department  Progress note created (see row info) Yes (mentioned in progress notes)  created note for Darolyn Rua RN

## 2020-07-06 NOTE — Progress Notes (Signed)
PHARMACY - PHYSICIAN COMMUNICATION CRITICAL VALUE ALERT - BLOOD CULTURE IDENTIFICATION (BCID)  Jennifer Mueller is an 54 y.o. female who presented to [facility] on 07/05/2020 with a chief complaint of pyelonephritis  Assessment:  Kleb pneumo in 4 of 4 bottles, no resistance detected.  (include suspected source if known)  Name of physician (or Provider) Contacted: Cliffton Asters   Current antibiotics: Ceftriaxone 1 gm IV Q24H   Changes to prescribed antibiotics recommended:  Recommendations accepted by provider  Will increase dose to ceftriaxone 2 gm IV Q24H   Results for orders placed or performed during the hospital encounter of 07/05/20  Blood Culture ID Panel (Reflexed) (Collected: 07/05/2020 11:54 AM)  Result Value Ref Range   Enterococcus faecalis NOT DETECTED NOT DETECTED   Enterococcus Faecium NOT DETECTED NOT DETECTED   Listeria monocytogenes NOT DETECTED NOT DETECTED   Staphylococcus species NOT DETECTED NOT DETECTED   Staphylococcus aureus (BCID) NOT DETECTED NOT DETECTED   Staphylococcus epidermidis NOT DETECTED NOT DETECTED   Staphylococcus lugdunensis NOT DETECTED NOT DETECTED   Streptococcus species NOT DETECTED NOT DETECTED   Streptococcus agalactiae NOT DETECTED NOT DETECTED   Streptococcus pneumoniae NOT DETECTED NOT DETECTED   Streptococcus pyogenes NOT DETECTED NOT DETECTED   A.calcoaceticus-baumannii NOT DETECTED NOT DETECTED   Bacteroides fragilis NOT DETECTED NOT DETECTED   Enterobacterales DETECTED (A) NOT DETECTED   Enterobacter cloacae complex NOT DETECTED NOT DETECTED   Escherichia coli NOT DETECTED NOT DETECTED   Klebsiella aerogenes NOT DETECTED NOT DETECTED   Klebsiella oxytoca NOT DETECTED NOT DETECTED   Klebsiella pneumoniae DETECTED (A) NOT DETECTED   Proteus species NOT DETECTED NOT DETECTED   Salmonella species NOT DETECTED NOT DETECTED   Serratia marcescens NOT DETECTED NOT DETECTED   Haemophilus influenzae NOT DETECTED NOT DETECTED   Neisseria  meningitidis NOT DETECTED NOT DETECTED   Pseudomonas aeruginosa NOT DETECTED NOT DETECTED   Stenotrophomonas maltophilia NOT DETECTED NOT DETECTED   Candida albicans NOT DETECTED NOT DETECTED   Candida auris NOT DETECTED NOT DETECTED   Candida glabrata NOT DETECTED NOT DETECTED   Candida krusei NOT DETECTED NOT DETECTED   Candida parapsilosis NOT DETECTED NOT DETECTED   Candida tropicalis NOT DETECTED NOT DETECTED   Cryptococcus neoformans/gattii NOT DETECTED NOT DETECTED   CTX-M ESBL NOT DETECTED NOT DETECTED   Carbapenem resistance IMP NOT DETECTED NOT DETECTED   Carbapenem resistance KPC NOT DETECTED NOT DETECTED   Carbapenem resistance NDM NOT DETECTED NOT DETECTED   Carbapenem resist OXA 48 LIKE NOT DETECTED NOT DETECTED   Carbapenem resistance VIM NOT DETECTED NOT DETECTED    [name] D 07/06/2020  1:21 AM

## 2020-07-06 NOTE — Progress Notes (Signed)
E link notified of code sepsis on patient.

## 2020-07-06 NOTE — Progress Notes (Signed)
CODE SEPSIS - PHARMACY COMMUNICATION  **Broad Spectrum Antibiotics should be administered within 1 hour of Sepsis diagnosis**  Time Code Sepsis Called/Page Received: @ 1334  Antibiotics Ordered: Ceftriaxone   Time of 1st antibiotic administration: @ 1828   Gardner Candle, PharmD, BCPS Clinical Pharmacist 07/06/2020 2:00 PM

## 2020-07-06 NOTE — Progress Notes (Signed)
PROGRESS NOTE    Jennifer Mueller  NWG:[id number] DOB: 15-Jul-1966 DOA: 07/05/2020 PCP: Cathie Hoops, PA   Brief Narrative:  54 year old female with past medical history significant for hypertension, hyperlipidemia, migraines, obesity who presented to the emergency department with chief complaint of abdominal pain, nausea, vomiting  Patient was noted to have urinalysis consistent with infection.  Follow-up imaging demonstrated perinephric and periureter fat stranding consistent with acute pyelonephritis.  Blood cultures taken on admission positive for Klebsiella species.  Sensitivities pending.  Patient was hemodynamically stable on admission however subsequently developed a fever T-max 101.4.  Also had acute kidney injury on admission.  Was started on intravenous fluids with recovery of kidney function.   Assessment & Plan:   Principal Problem:   Pyelonephritis Active Problems:   Essential hypertension   Hyperlipidemia   Obesity, Class III, BMI 40-49.9 (morbid obesity) (HCC)  Klebsiella bacteremia Bilateral pyelonephritis Severe sepsis secondary to above Patient meets sepsis criteria with a leukocytosis and temperature and tachycardia.  Source is urinary with bacteremic extension. Initial blood cultures positive for Klebsiella, sensitivities pending Patient symptoms improved after initiation of antibiotics and intravenous fluids Sepsis physiology improving Plan: Continue treatment as fluids Continue Rocephin 2 g every 24 hours Monitor blood cultures for pathogen identification Pain control as needed Antiemetics as needed  Acute kidney injury Likely in the setting of infection Improving over interval No evidence of chronic kidney disease at this time Continue intravenous fluids Treat infection as above Avoid nephrotoxins  Intractable nausea and vomiting Improved Continue as needed antiemetics  Diarrhea Appears resolved at this time DC C. difficile and GI PCR  panel DC isolation precautions  Hypokalemia Hypernatremia Secondary to GI losses Improving over interval Check daily BMP and replace electrolytes as needed  Hypertension Continue amlodipine 5 mg daily metoprolol 100 mg daily per home dose  Sinus tachycardia  improved Appears related to infection and volume depletion Continue metoprolol per home dose  Hyperlipidemia Continue home statin  Obesity BMI 45.76 This complicates overall prognosis and care   DVT prophylaxis: Lovenox Code Status: Full Family Communication: None today Disposition Plan: Status is: Inpatient  Remains inpatient appropriate because:Inpatient level of care appropriate due to severity of illness   Dispo: The patient is from: Home              Anticipated d/c is to: Home              Anticipated d/c date is: 1 day              Patient currently is not medically stable to d/c.   Patient with severe sepsis and gram-negative bacteremia.  Will need additional least 1 day inpatient treatment and monitoring prior to disposition planning.      Consultants:   None  Procedures: (Don't include imaging studies which can be auto populated. Include things that cannot be auto populated i.e. Echo, Carotid and venous dopplers, Foley, Bipap, HD, tubes/drains, wound vac, central lines etc)  None  Antimicrobials: (specify start and planned stop date. Auto populated tables are space occupying and do not give end dates)  Rocephin   Subjective: Seen and examined.  Reports improvement in abdominal pain.  Able to tolerate small amounts of p.o.  Objective: Vitals:   07/06/20 0346 07/06/20 0750 07/06/20 1214 07/06/20 1214  BP: (!) 151/85 (!) 150/89  (!) 150/93  Pulse: 84 90  88  Resp: (!) 24 20  18   Temp: 98.8 F (37.1 C) 98.6 F (37 C) 99.5  F (37.5 C)   TempSrc: Oral Oral Oral   SpO2: 95% 94%  94%  Weight:      Height:        Intake/Output Summary (Last 24 hours) at 07/06/2020 1313 Last data filed  at 07/05/2020 2223 Gross per 24 hour  Intake 120 ml  Output 380 ml  Net -260 ml   Filed Weights   07/05/20 0834  Weight: 124.7 kg    Examination:  General exam: Appears calm and comfortable  Respiratory system: Clear to auscultation. Respiratory effort normal. Cardiovascular system: S1 & S2 heard, RRR. No JVD, murmurs, rubs, gallops or clicks. No pedal edema. Gastrointestinal system: Diffuse tenderness to palpation.  Normal bowel sounds.  No distention Central nervous system: Alert and oriented. No focal neurological deficits. Extremities: Symmetric 5 x 5 power. Skin: No rashes, lesions or ulcers Psychiatry: Judgement and insight appear normal. Mood & affect appropriate.     Data Reviewed: I have personally reviewed following labs and imaging studies  CBC: Recent Labs  Lab 07/05/20 0839 07/06/20 0601  WBC 18.2* 14.7*  HGB 13.2 12.1  HCT 40.0 36.7  MCV 77.7* 77.9*  PLT 287 263   Basic Metabolic Panel: Recent Labs  Lab 07/05/20 0839 07/05/20 1812 07/06/20 0601  NA 129*  --  133*  K 2.9*  --  3.3*  CL 88*  --  93*  CO2 29  --  31  GLUCOSE 208*  --  101*  BUN 16  --  16  CREATININE 1.51*  --  1.17*  CALCIUM 8.5*  --  8.2*  MG  --  2.3  --    GFR: Estimated Creatinine Clearance: 73.8 mL/min (A) (by C-G formula based on SCr of 1.17 mg/dL (H)). Liver Function Tests: Recent Labs  Lab 07/05/20 0839  AST 36  ALT 37  ALKPHOS 114  BILITOT 2.3*  PROT 7.7  ALBUMIN 2.6*   Recent Labs  Lab 07/05/20 0839  LIPASE 25   No results for input(s): AMMONIA in the last 168 hours. Coagulation Profile: No results for input(s): INR, PROTIME in the last 168 hours. Cardiac Enzymes: No results for input(s): CKTOTAL, CKMB, CKMBINDEX, TROPONINI in the last 168 hours. BNP (last 3 results) No results for input(s): PROBNP in the last 8760 hours. HbA1C: No results for input(s): HGBA1C in the last 72 hours. CBG: No results for input(s): GLUCAP in the last 168 hours. Lipid  Profile: No results for input(s): CHOL, HDL, LDLCALC, TRIG, CHOLHDL, LDLDIRECT in the last 72 hours. Thyroid Function Tests: No results for input(s): TSH, T4TOTAL, FREET4, T3FREE, THYROIDAB in the last 72 hours. Anemia Panel: No results for input(s): VITAMINB12, FOLATE, FERRITIN, TIBC, IRON, RETICCTPCT in the last 72 hours. Sepsis Labs: Recent Labs  Lab 07/05/20 1154 07/05/20 1812  LATICACIDVEN 1.3 1.3    Recent Results (from the past 240 hour(s))  Blood culture (routine x 2)     Status: None (Preliminary result)   Collection Time: 07/05/20 11:54 AM   Specimen: BLOOD  Result Value Ref Range Status   Specimen Description BLOOD LEFT ANTECUBITAL  Final   Special Requests   Final    BOTTLES DRAWN AEROBIC AND ANAEROBIC Blood Culture adequate volume   Culture  Setup Time   Final    GRAM NEGATIVE RODS IN BOTH AEROBIC AND ANAEROBIC BOTTLES Organism ID to follow CRITICAL RESULT CALLED TO, READ BACK BY AND VERIFIED WITH: Princella Ion RN [phone] 07/06/20 HNM Performed at [facility], [address] [place],  [place] [address]    Culture GRAM NEGATIVE RODS  Final   Report Status PENDING  Incomplete  Blood culture (routine x 2)     Status: None (Preliminary result)   Collection Time: 07/05/20 11:54 AM   Specimen: BLOOD  Result Value Ref Range Status   Specimen Description BLOOD BLOOD RIGHT HAND  Final   Special Requests   Final    BOTTLES DRAWN AEROBIC AND ANAEROBIC Blood Culture results may not be optimal due to an inadequate volume of blood received in culture bottles   Culture  Setup Time   Final    GRAM NEGATIVE RODS IN BOTH AEROBIC AND ANAEROBIC BOTTLES CRITICAL VALUE NOTED.  VALUE IS CONSISTENT WITH PREVIOUSLY REPORTED AND CALLED VALUE. Performed at [facility], [address] [place], [place] [address]    Culture GRAM NEGATIVE RODS  Final   Report Status PENDING  Incomplete  Blood Culture ID Panel (Reflexed)     Status: Abnormal   Collection Time: 07/05/20  11:54 AM  Result Value Ref Range Status   Enterococcus faecalis NOT DETECTED NOT DETECTED Final   Enterococcus Faecium NOT DETECTED NOT DETECTED Final   Listeria monocytogenes NOT DETECTED NOT DETECTED Final   Staphylococcus species NOT DETECTED NOT DETECTED Final   Staphylococcus aureus (BCID) NOT DETECTED NOT DETECTED Final   Staphylococcus epidermidis NOT DETECTED NOT DETECTED Final   Staphylococcus lugdunensis NOT DETECTED NOT DETECTED Final   Streptococcus species NOT DETECTED NOT DETECTED Final   Streptococcus agalactiae NOT DETECTED NOT DETECTED Final   Streptococcus pneumoniae NOT DETECTED NOT DETECTED Final   Streptococcus pyogenes NOT DETECTED NOT DETECTED Final   A.calcoaceticus-baumannii NOT DETECTED NOT DETECTED Final   Bacteroides fragilis NOT DETECTED NOT DETECTED Final   Enterobacterales DETECTED (A) NOT DETECTED Final    Comment: Enterobacterales represent a large order of gram negative bacteria, not a single organism. CRITICAL RESULT CALLED TO, READ BACK BY AND VERIFIED WITH: Princella Ion PHAMRD [id number] 07/06/20 HNM    Enterobacter cloacae complex NOT DETECTED NOT DETECTED Final   Escherichia coli NOT DETECTED NOT DETECTED Final   Klebsiella aerogenes NOT DETECTED NOT DETECTED Final   Klebsiella oxytoca NOT DETECTED NOT DETECTED Final   Klebsiella pneumoniae DETECTED (A) NOT DETECTED Final    Comment: CRITICAL RESULT CALLED TO, READ BACK BY AND VERIFIED WITH: Princella Ion PHARMD [id number] 07/06/20 HNM    Proteus species NOT DETECTED NOT DETECTED Final   Salmonella species NOT DETECTED NOT DETECTED Final   Serratia marcescens NOT DETECTED NOT DETECTED Final   Haemophilus influenzae NOT DETECTED NOT DETECTED Final   Neisseria meningitidis NOT DETECTED NOT DETECTED Final   Pseudomonas aeruginosa NOT DETECTED NOT DETECTED Final   Stenotrophomonas maltophilia NOT DETECTED NOT DETECTED Final   Candida albicans NOT DETECTED NOT DETECTED Final   Candida auris NOT DETECTED NOT  DETECTED Final   Candida glabrata NOT DETECTED NOT DETECTED Final   Candida krusei NOT DETECTED NOT DETECTED Final   Candida parapsilosis NOT DETECTED NOT DETECTED Final   Candida tropicalis NOT DETECTED NOT DETECTED Final   Cryptococcus neoformans/gattii NOT DETECTED NOT DETECTED Final   CTX-M ESBL NOT DETECTED NOT DETECTED Final   Carbapenem resistance IMP NOT DETECTED NOT DETECTED Final   Carbapenem resistance KPC NOT DETECTED NOT DETECTED Final   Carbapenem resistance NDM NOT DETECTED NOT DETECTED Final   Carbapenem resist OXA 48 LIKE NOT DETECTED NOT DETECTED Final   Carbapenem resistance VIM NOT DETECTED NOT DETECTED Final    Comment: Performed at  [facility], [address] [place], [place] [address]  Respiratory Panel by RT PCR (Flu A&B, Covid) - Nasopharyngeal Swab     Status: None   Collection Time: 07/05/20 12:54 PM   Specimen: Nasopharyngeal Swab  Result Value Ref Range Status   SARS Coronavirus 2 by RT PCR NEGATIVE NEGATIVE Final    Comment: (NOTE) SARS-CoV-2 target nucleic acids are NOT DETECTED.  The SARS-CoV-2 RNA is generally detectable in upper respiratoy specimens during the acute phase of infection. The lowest concentration of SARS-CoV-2 viral copies this assay can detect is 131 copies/mL. A negative result does not preclude SARS-Cov-2 infection and should not be used as the sole basis for treatment or other patient management decisions. A negative result may occur with  improper specimen collection/handling, submission of specimen other than nasopharyngeal swab, presence of viral mutation(s) within the areas targeted by this assay, and inadequate number of viral copies (<131 copies/mL). A negative result must be combined with clinical observations, patient history, and epidemiological information. The expected result is Negative.  Fact Sheet for Patients:  https://www.moore.com/  Fact Sheet for Healthcare Providers:    https://www.young.biz/  This test is no t yet approved or cleared by the [place] FDA and  has been authorized for detection and/or diagnosis of SARS-CoV-2 by FDA under an Emergency Use Authorization (EUA). This EUA will remain  in effect (meaning this test can be used) for the duration of the COVID-19 declaration under Section 564(b)(1) of the Act, 21 U.S.C. section 360bbb-3(b)(1), unless the authorization is terminated or revoked sooner.     Influenza A by PCR NEGATIVE NEGATIVE Final   Influenza B by PCR NEGATIVE NEGATIVE Final    Comment: (NOTE) The Xpert Xpress SARS-CoV-2/FLU/RSV assay is intended as an aid in  the diagnosis of influenza from Nasopharyngeal swab specimens and  should not be used as a sole basis for treatment. Nasal washings and  aspirates are unacceptable for Xpert Xpress SARS-CoV-2/FLU/RSV  testing.  Fact Sheet for Patients: https://www.moore.com/  Fact Sheet for Healthcare Providers: https://www.young.biz/  This test is not yet approved or cleared by the [place] FDA and  has been authorized for detection and/or diagnosis of SARS-CoV-2 by  FDA under an Emergency Use Authorization (EUA). This EUA will remain  in effect (meaning this test can be used) for the duration of the  Covid-19 declaration under Section 564(b)(1) of the Act, 21  U.S.C. section 360bbb-3(b)(1), unless the authorization is  terminated or revoked. Performed at [facility] [place] [facility], [address] [place], [place] [address]          Radiology Studies: CT ABDOMEN PELVIS W CONTRAST  Result Date: 07/05/2020 CLINICAL DATA:  Elevated WBC, abdominal pain EXAM: CT ABDOMEN AND PELVIS WITH CONTRAST TECHNIQUE: Multidetector CT imaging of the abdomen and pelvis was performed using the standard protocol following bolus administration of intravenous contrast. CONTRAST:  33mL OMNIPAQUE IOHEXOL 300 MG/ML  SOLN COMPARISON:   None. FINDINGS: Lower chest: Mild bibasilar atelectasis. Hepatobiliary: No focal liver abnormality is seen. No gallstones, gallbladder wall thickening, or biliary dilatation. Pancreas: Unremarkable. Spleen: Unremarkable. Adrenals/Urinary Tract: Adrenals are unremarkable. There is bilateral perinephric and periureteric stranding. Mild bilateral hydroureteronephrosis. There is urothelial enhancement the ureters and renal collecting systems. Bladder is unremarkable. Stomach/Bowel: Small hiatal hernia.  Bowel is normal in caliber. Vascular/Lymphatic: No significant vascular findings are present. No enlarged lymph nodes identified. Reproductive: Intrauterine device is present.  No adnexal mass. Other: No ascites.  No acute abnormality of the abdominal wall.  Musculoskeletal: Lower lumbar spine degenerative changes. IMPRESSION: Findings suspicious for bilateral ascending urinary tract infection. No obstructing stone. Electronically Signed   By: Guadlupe SpanishPraneil  Patel M.D.   On: 07/05/2020 12:18        Scheduled Meds: . amLODipine  5 mg Oral Daily  . enoxaparin (LOVENOX) injection  0.5 mg/kg Subcutaneous Q24H  . fluticasone  2 spray Each Nare Daily  . metoprolol tartrate  100 mg Oral BH-q7a  . pravastatin  40 mg Oral Daily   Continuous Infusions: . cefTRIAXone (ROCEPHIN)  IV 2 g (07/06/20 0922)  . lactated ringers 75 mL/hr at 07/06/20 0916     LOS: 0 days    Time spent: 25 minutes    Tresa MooreSudheer B Nannette Zill, MD [facility] Pager [phone]  If 7PM-7AM, please contact night-coverage [email or web address] Password [email or web address] 07/06/2020, 1:13 PM

## 2020-07-06 NOTE — Progress Notes (Signed)
   07/06/20 1354  Assess: MEWS Score  Temp 100.1 F (37.8 C)  BP (!) 145/93  Pulse Rate 89  Resp (!) 26  Level of Consciousness Alert  SpO2 92 %  O2 Device Room Air  Assess: MEWS Score  MEWS Temp 0  MEWS Systolic 0  MEWS Pulse 0  MEWS RR 2  MEWS LOC 0  MEWS Score 2  MEWS Score Color Yellow  Assess: if the MEWS score is Yellow or Red  Were vital signs taken at a resting state? Yes  Focused Assessment No change from prior assessment  Early Detection of Sepsis Score *See Row Information* Low  MEWS guidelines implemented *See Row Information* No, other (Comment) (patient code sepsis, vital frequency already changed)  Treat  Pain Scale 0-10  Pain Score 0  Escalate  MEWS: Escalate Yellow: discuss with charge nurse/RN and consider discussing with provider and RRT  Notify: Charge Nurse/RN  Name of Charge Nurse/RN Notified Ree Kida  Date Charge Nurse/RN Notified 07/06/20  Time Charge Nurse/RN Notified 1356

## 2020-07-06 NOTE — Progress Notes (Signed)
Secure chat to Dr. Georgeann Oppenheim to inform of temp 102.5, stated to give PRN Tylenol that is ordered for mild pain/headache.

## 2020-07-06 NOTE — Plan of Care (Addendum)
Pt Axox4. Calm and cooperative and able to voice her needs. Pt monitored closely at beginning of the shift for yellow mews, with vitals being monitored 2 hours. Tmax 101.40F with Tylenol administered and effective. One Red mews due to varying RR. Pt on IVF and IV Abx. Pt independent. Last two vitals reading with Green mews. Pt reported feeling much better. Pt on enteric precautions to rule out c-diff. No BM overnight. Safety measures in place. Will continue to monitor.  Problem: Education: Goal: Knowledge of General Education information will improve Description: Including pain rating scale, medication(s)/side effects and non-pharmacologic comfort measures Outcome: Progressing   Problem: Health Behavior/Discharge Planning: Goal: Ability to manage health-related needs will improve Outcome: Progressing   Problem: Clinical Measurements: Goal: Ability to maintain clinical measurements within normal limits will improve Outcome: Progressing Goal: Will remain free from infection Outcome: Progressing Goal: Diagnostic test results will improve Outcome: Progressing Goal: Respiratory complications will improve Outcome: Progressing Goal: Cardiovascular complication will be avoided Outcome: Progressing   Problem: Activity: Goal: Risk for activity intolerance will decrease Outcome: Progressing   Problem: Nutrition: Goal: Adequate nutrition will be maintained Outcome: Progressing   Problem: Coping: Goal: Level of anxiety will decrease Outcome: Progressing   Problem: Elimination: Goal: Will not experience complications related to bowel motility Outcome: Progressing Goal: Will not experience complications related to urinary retention Outcome: Progressing   Problem: Pain Managment: Goal: General experience of comfort will improve Outcome: Progressing   Problem: Safety: Goal: Ability to remain free from injury will improve Outcome: Progressing   Problem: Skin Integrity: Goal: Risk for  impaired skin integrity will decrease Outcome: Progressing

## 2020-07-06 NOTE — Progress Notes (Signed)
Note for Code Sepsis: activated at 1333 but timer did not start. Pt had lactic acid on 11/03 1226, blood cultures 11/3 at 1226. Rocephin given previously. Lactic acid done 11/4 at 1333. Unable to document in sepsis section of chart.

## 2020-07-07 DIAGNOSIS — I10 Essential (primary) hypertension: Secondary | ICD-10-CM | POA: Diagnosis not present

## 2020-07-07 DIAGNOSIS — N12 Tubulo-interstitial nephritis, not specified as acute or chronic: Secondary | ICD-10-CM | POA: Diagnosis not present

## 2020-07-07 DIAGNOSIS — R6520 Severe sepsis without septic shock: Secondary | ICD-10-CM | POA: Diagnosis not present

## 2020-07-07 DIAGNOSIS — A4150 Gram-negative sepsis, unspecified: Secondary | ICD-10-CM | POA: Diagnosis not present

## 2020-07-07 LAB — BASIC METABOLIC PANEL
Anion gap: 9 (ref 5–15)
BUN: 14 mg/dL (ref 6–20)
CO2: 28 mmol/L (ref 22–32)
Calcium: 8.7 mg/dL — ABNORMAL LOW (ref 8.9–10.3)
Chloride: 95 mmol/L — ABNORMAL LOW (ref 98–111)
Creatinine, Ser: 0.94 mg/dL (ref 0.44–1.00)
GFR, Estimated: >60 mL/min (ref >60)
Glucose, Bld: 98 mg/dL (ref 70–99)
Potassium: 3.4 mmol/L — ABNORMAL LOW (ref 3.5–5.1)
Sodium: 132 mmol/L — ABNORMAL LOW (ref 135–145)

## 2020-07-07 LAB — CBC WITH DIFFERENTIAL/PLATELET
Abs Immature Granulocytes: 0.23 10*3/uL — ABNORMAL HIGH (ref 0.00–0.07)
Basophils Absolute: 0 10*3/uL (ref 0.0–0.1)
Basophils Relative: 0 %
Eosinophils Absolute: 0.1 10*3/uL (ref 0.0–0.5)
Eosinophils Relative: 0 %
HCT: 37.3 % (ref 36.0–46.0)
Hemoglobin: 12.1 g/dL (ref 12.0–15.0)
Immature Granulocytes: 2 %
Lymphocytes Relative: 12 %
Lymphs Abs: 1.3 10*3/uL (ref 0.7–4.0)
MCH: 25.4 pg — ABNORMAL LOW (ref 26.0–34.0)
MCHC: 32.4 g/dL (ref 30.0–36.0)
MCV: 78.4 fL — ABNORMAL LOW (ref 80.0–100.0)
Monocytes Absolute: 0.9 10*3/uL (ref 0.1–1.0)
Monocytes Relative: 8 %
Neutro Abs: 8.6 10*3/uL — ABNORMAL HIGH (ref 1.7–7.7)
Neutrophils Relative %: 78 %
Platelets: 300 10*3/uL (ref 150–400)
RBC: 4.76 MIL/uL (ref 3.87–5.11)
RDW: 14.3 % (ref 11.5–15.5)
WBC: 11.2 10*3/uL — ABNORMAL HIGH (ref 4.0–10.5)
nRBC: 0 % (ref 0.0–0.2)

## 2020-07-07 NOTE — Progress Notes (Signed)
PROGRESS NOTE    Jennifer Mueller  DJS:[id number] DOB: [birth year] DOA: 07/05/2020 PCP: Cathie Hoops, PA   Brief Narrative:  54 year old female with past medical history significant for hypertension, hyperlipidemia, migraines, obesity who presented to the emergency department with chief complaint of abdominal pain, nausea, vomiting  Patient was noted to have urinalysis consistent with infection.  Follow-up imaging demonstrated perinephric and periureter fat stranding consistent with acute pyelonephritis.  Blood cultures taken on admission positive for Klebsiella species.  Sensitivities pending.  Patient continues to clinically improve.  Blood cultures now positive for Klebsiella pneumonia.  Sensitivities pending.  Intermittent fevers back to clinically improving.   Assessment & Plan:   Principal Problem:   Pyelonephritis Active Problems:   Essential hypertension   Hyperlipidemia   Obesity, Class III, BMI 40-49.9 (morbid obesity) (HCC)   Severe sepsis with acute organ dysfunction due to Gram negative bacteria (HCC)  Klebsiella bacteremia Bilateral pyelonephritis Severe sepsis secondary to above Patient meets sepsis criteria with a leukocytosis and temperature and tachycardia.  Source is urinary with bacteremic extension. Initial blood cultures positive for Klebsiella, sensitivities pending Patient symptoms improved after initiation of antibiotics and intravenous fluids Sepsis physiology improving Plan: Continue intravenous fluids Continue Rocephin 2 g every 24 hours Monitor blood cultures for sensitivities Pain control as needed Antiemetics as needed  Acute kidney injury, improving Likely in the setting of infection Improving over interval No evidence of chronic kidney disease at this time Continue intravenous fluids Treat infection as above Avoid nephrotoxins  Intractable nausea and vomiting Improved Continue as needed antiemetics  Diarrhea Appears resolved  at this time DC C. difficile and GI PCR panel DC isolation precautions  Hypokalemia Hypernatremia Secondary to GI losses Improving over interval Check daily BMP and replace electrolytes as needed  Hypertension Continue amlodipine 5 mg daily  metoprolol 100 mg daily per home dose As needed IV hydralazine  Sinus tachycardia  improved Appears related to infection and volume depletion Continue metoprolol per home dose  Hyperlipidemia Continue home statin  Obesity BMI 45.76 This complicates overall prognosis and care   DVT prophylaxis: Lovenox Code Status: Full Family Communication: None today Disposition Plan: Status is: Inpatient  Remains inpatient appropriate because:Inpatient level of care appropriate due to severity of illness   Dispo: The patient is from: Home              Anticipated d/c is to: Home              Anticipated d/c date is: 1 day              Patient currently is not medically stable to d/c.   Anticipate 1 additional day of inpatient treatment and monitoring.  If brought up tomorrow patient remains fever free and sensitivities allow for transition to oral antibiotics anticipate discharge home.   Consultants:   None  Procedures:   None  Antimicrobials:   Rocephin   Subjective: Seen and examined.  Endorses improvement in abdominal pain.  Tolerating oral intake without nausea or vomiting. Objective: Vitals:   07/07/20 0510 07/07/20 0715 07/07/20 0918 07/07/20 1217  BP: (!) 161/92 (!) 157/100 138/81 (!) 192/108  Pulse: 99 86 80 85  Resp: 20 20 (!) 22 20  Temp: 99.1 F (37.3 C) 98.2 F (36.8 C) 98.3 F (36.8 C) 97.6 F (36.4 C)  TempSrc: Oral Oral Oral Oral  SpO2: 95% 95% 95% 95%  Weight:      Height:        Intake/Output  Summary (Last 24 hours) at 07/07/2020 1410 Last data filed at 07/07/2020 1057 Gross per 24 hour  Intake 3919.4 ml  Output --  Net 3919.4 ml   Filed Weights   07/05/20 0834  Weight: 124.7 kg     Examination:  General exam: Appears calm and comfortable  Respiratory system: Clear to auscultation. Respiratory effort normal. Cardiovascular system: S1 & S2 heard, RRR. No JVD, murmurs, rubs, gallops or clicks. No pedal edema. Gastrointestinal system: Diffuse tenderness to palpation.  Normal bowel sounds.  No distention Central nervous system: Alert and oriented. No focal neurological deficits. Extremities: Symmetric 5 x 5 power. Skin: No rashes, lesions or ulcers Psychiatry: Judgement and insight appear normal. Mood & affect appropriate.     Data Reviewed: I have personally reviewed following labs and imaging studies  CBC: Recent Labs  Lab 07/05/20 0839 07/06/20 0601 07/07/20 0810  WBC 18.2* 14.7* 11.2*  NEUTROABS  --   --  8.6*  HGB 13.2 12.1 12.1  HCT 40.0 36.7 37.3  MCV 77.7* 77.9* 78.4*  PLT 287 263 300   Basic Metabolic Panel: Recent Labs  Lab 07/05/20 0839 07/05/20 1812 07/06/20 0601 07/07/20 0810  NA 129*  --  133* 132*  K 2.9*  --  3.3* 3.4*  CL 88*  --  93* 95*  CO2 29  --  31 28  GLUCOSE 208*  --  101* 98  BUN 16  --  16 14  CREATININE 1.51*  --  1.17* 0.94  CALCIUM 8.5*  --  8.2* 8.7*  MG  --  2.3  --   --    GFR: Estimated Creatinine Clearance: 91.9 mL/min (by C-G formula based on SCr of 0.94 mg/dL). Liver Function Tests: Recent Labs  Lab 07/05/20 0839  AST 36  ALT 37  ALKPHOS 114  BILITOT 2.3*  PROT 7.7  ALBUMIN 2.6*   Recent Labs  Lab 07/05/20 0839  LIPASE 25   No results for input(s): AMMONIA in the last 168 hours. Coagulation Profile: No results for input(s): INR, PROTIME in the last 168 hours. Cardiac Enzymes: No results for input(s): CKTOTAL, CKMB, CKMBINDEX, TROPONINI in the last 168 hours. BNP (last 3 results) No results for input(s): PROBNP in the last 8760 hours. HbA1C: No results for input(s): HGBA1C in the last 72 hours. CBG: No results for input(s): GLUCAP in the last 168 hours. Lipid Profile: No results for  input(s): CHOL, HDL, LDLCALC, TRIG, CHOLHDL, LDLDIRECT in the last 72 hours. Thyroid Function Tests: No results for input(s): TSH, T4TOTAL, FREET4, T3FREE, THYROIDAB in the last 72 hours. Anemia Panel: No results for input(s): VITAMINB12, FOLATE, FERRITIN, TIBC, IRON, RETICCTPCT in the last 72 hours. Sepsis Labs: Recent Labs  Lab 07/05/20 1154 07/05/20 1812 07/06/20 1459  LATICACIDVEN 1.3 1.3 1.1    Recent Results (from the past 240 hour(s))  Blood culture (routine x 2)     Status: Abnormal (Preliminary result)   Collection Time: 07/05/20 11:54 AM   Specimen: BLOOD  Result Value Ref Range Status   Specimen Description   Final    BLOOD LEFT ANTECUBITAL Performed at [facility], [address] [place], [place] [address]    Special Requests   Final    BOTTLES DRAWN AEROBIC AND ANAEROBIC Blood Culture adequate volume Performed at [facility], [address] [place], [place] [address]    Culture  Setup Time   Final    GRAM NEGATIVE RODS IN BOTH AEROBIC AND ANAEROBIC BOTTLES CRITICAL RESULT CALLED  TO, READ BACK BY AND VERIFIED WITH: Princella Ion RN [id number] 07/06/20 HNM    Culture (A)  Final    KLEBSIELLA PNEUMONIAE SUSCEPTIBILITIES TO FOLLOW Performed at [facility], [address], [place], [place] [address]    Report Status PENDING  Incomplete  Blood culture (routine x 2)     Status: Abnormal (Preliminary result)   Collection Time: 07/05/20 11:54 AM   Specimen: BLOOD  Result Value Ref Range Status   Specimen Description   Final    BLOOD BLOOD RIGHT HAND Performed at [facility], [address] [place], [place] [address]    Special Requests   Final    BOTTLES DRAWN AEROBIC AND ANAEROBIC Blood Culture results may not be optimal due to an inadequate volume of blood received in culture bottles Performed at [facility], [address] [place], [place] [address]    Culture  Setup Time   Final    GRAM NEGATIVE RODS IN BOTH  AEROBIC AND ANAEROBIC BOTTLES CRITICAL VALUE NOTED.  VALUE IS CONSISTENT WITH PREVIOUSLY REPORTED AND CALLED VALUE. Performed at [facility], [address] [place], [place] [address]    Culture KLEBSIELLA PNEUMONIAE (A)  Final   Report Status PENDING  Incomplete  Blood Culture ID Panel (Reflexed)     Status: Abnormal   Collection Time: 07/05/20 11:54 AM  Result Value Ref Range Status   Enterococcus faecalis NOT DETECTED NOT DETECTED Final   Enterococcus Faecium NOT DETECTED NOT DETECTED Final   Listeria monocytogenes NOT DETECTED NOT DETECTED Final   Staphylococcus species NOT DETECTED NOT DETECTED Final   Staphylococcus aureus (BCID) NOT DETECTED NOT DETECTED Final   Staphylococcus epidermidis NOT DETECTED NOT DETECTED Final   Staphylococcus lugdunensis NOT DETECTED NOT DETECTED Final   Streptococcus species NOT DETECTED NOT DETECTED Final   Streptococcus agalactiae NOT DETECTED NOT DETECTED Final   Streptococcus pneumoniae NOT DETECTED NOT DETECTED Final   Streptococcus pyogenes NOT DETECTED NOT DETECTED Final   A.calcoaceticus-baumannii NOT DETECTED NOT DETECTED Final   Bacteroides fragilis NOT DETECTED NOT DETECTED Final   Enterobacterales DETECTED (A) NOT DETECTED Final    Comment: Enterobacterales represent a large order of gram negative bacteria, not a single organism. CRITICAL RESULT CALLED TO, READ BACK BY AND VERIFIED WITH: Princella Ion PHAMRD [id number] 07/06/20 HNM    Enterobacter cloacae complex NOT DETECTED NOT DETECTED Final   Escherichia coli NOT DETECTED NOT DETECTED Final   Klebsiella aerogenes NOT DETECTED NOT DETECTED Final   Klebsiella oxytoca NOT DETECTED NOT DETECTED Final   Klebsiella pneumoniae DETECTED (A) NOT DETECTED Final    Comment: CRITICAL RESULT CALLED TO, READ BACK BY AND VERIFIED WITH: Princella Ion PHARMD [id number] 07/06/20 HNM    Proteus species NOT DETECTED NOT DETECTED Final   Salmonella species NOT DETECTED NOT DETECTED Final   Serratia  marcescens NOT DETECTED NOT DETECTED Final   Haemophilus influenzae NOT DETECTED NOT DETECTED Final   Neisseria meningitidis NOT DETECTED NOT DETECTED Final   Pseudomonas aeruginosa NOT DETECTED NOT DETECTED Final   Stenotrophomonas maltophilia NOT DETECTED NOT DETECTED Final   Candida albicans NOT DETECTED NOT DETECTED Final   Candida auris NOT DETECTED NOT DETECTED Final   Candida glabrata NOT DETECTED NOT DETECTED Final   Candida krusei NOT DETECTED NOT DETECTED Final   Candida parapsilosis NOT DETECTED NOT DETECTED Final   Candida tropicalis NOT DETECTED NOT DETECTED Final   Cryptococcus neoformans/gattii NOT DETECTED NOT DETECTED Final   CTX-M ESBL NOT DETECTED  NOT DETECTED Final   Carbapenem resistance IMP NOT DETECTED NOT DETECTED Final   Carbapenem resistance KPC NOT DETECTED NOT DETECTED Final   Carbapenem resistance NDM NOT DETECTED NOT DETECTED Final   Carbapenem resist OXA 48 LIKE NOT DETECTED NOT DETECTED Final   Carbapenem resistance VIM NOT DETECTED NOT DETECTED Final    Comment: Performed at [facility], [address] [place], [place] [address]  Respiratory Panel by RT PCR (Flu A&B, Covid) - Nasopharyngeal Swab     Status: None   Collection Time: 07/05/20 12:54 PM   Specimen: Nasopharyngeal Swab  Result Value Ref Range Status   SARS Coronavirus 2 by RT PCR NEGATIVE NEGATIVE Final    Comment: (NOTE) SARS-CoV-2 target nucleic acids are NOT DETECTED.  The SARS-CoV-2 RNA is generally detectable in upper respiratoy specimens during the acute phase of infection. The lowest concentration of SARS-CoV-2 viral copies this assay can detect is 131 copies/mL. A negative result does not preclude SARS-Cov-2 infection and should not be used as the sole basis for treatment or other patient management decisions. A negative result may occur with  improper specimen collection/handling, submission of specimen other than nasopharyngeal swab, presence of viral mutation(s)  within the areas targeted by this assay, and inadequate number of viral copies (<131 copies/mL). A negative result must be combined with clinical observations, patient history, and epidemiological information. The expected result is Negative.  Fact Sheet for Patients:  https://www.moore.com/  Fact Sheet for Healthcare Providers:  https://www.young.biz/  This test is no t yet approved or cleared by the [place] FDA and  has been authorized for detection and/or diagnosis of SARS-CoV-2 by FDA under an Emergency Use Authorization (EUA). This EUA will remain  in effect (meaning this test can be used) for the duration of the COVID-19 declaration under Section 564(b)(1) of the Act, 21 U.S.C. section 360bbb-3(b)(1), unless the authorization is terminated or revoked sooner.     Influenza A by PCR NEGATIVE NEGATIVE Final   Influenza B by PCR NEGATIVE NEGATIVE Final    Comment: (NOTE) The Xpert Xpress SARS-CoV-2/FLU/RSV assay is intended as an aid in  the diagnosis of influenza from Nasopharyngeal swab specimens and  should not be used as a sole basis for treatment. Nasal washings and  aspirates are unacceptable for Xpert Xpress SARS-CoV-2/FLU/RSV  testing.  Fact Sheet for Patients: https://www.moore.com/  Fact Sheet for Healthcare Providers: https://www.young.biz/  This test is not yet approved or cleared by the [place] FDA and  has been authorized for detection and/or diagnosis of SARS-CoV-2 by  FDA under an Emergency Use Authorization (EUA). This EUA will remain  in effect (meaning this test can be used) for the duration of the  Covid-19 declaration under Section 564(b)(1) of the Act, 21  U.S.C. section 360bbb-3(b)(1), unless the authorization is  terminated or revoked. Performed at [facility], [address], [place], [place] [address]          Radiology Studies: No results  found.      Scheduled Meds: . amLODipine  10 mg Oral Daily  . enoxaparin (LOVENOX) injection  0.5 mg/kg Subcutaneous Q24H  . fluticasone  2 spray Each Nare Daily  . metoprolol tartrate  100 mg Oral [id number]  . pravastatin  40 mg Oral Daily   Continuous Infusions: . cefTRIAXone (ROCEPHIN)  IV 2 g (07/07/20 0929)  . lactated ringers 75 mL/hr at 07/07/20 0724     LOS: 1 day    Time spent: 25 minutes  Tresa Moore, MD [facility] Pager [phone]  If 7PM-7AM, please contact night-coverage 07/07/2020, 2:10 PM

## 2020-07-08 DIAGNOSIS — N12 Tubulo-interstitial nephritis, not specified as acute or chronic: Secondary | ICD-10-CM | POA: Diagnosis not present

## 2020-07-08 LAB — BASIC METABOLIC PANEL
Anion gap: 11 (ref 5–15)
BUN: 15 mg/dL (ref 6–20)
CO2: 25 mmol/L (ref 22–32)
Calcium: 8.9 mg/dL (ref 8.9–10.3)
Chloride: 95 mmol/L — ABNORMAL LOW (ref 98–111)
Creatinine, Ser: 1 mg/dL (ref 0.44–1.00)
GFR, Estimated: >60 mL/min (ref >60)
Glucose, Bld: 106 mg/dL — ABNORMAL HIGH (ref 70–99)
Potassium: 3.2 mmol/L — ABNORMAL LOW (ref 3.5–5.1)
Sodium: 131 mmol/L — ABNORMAL LOW (ref 135–145)

## 2020-07-08 LAB — CBC WITH DIFFERENTIAL/PLATELET
Abs Immature Granulocytes: 0.13 10*3/uL — ABNORMAL HIGH (ref 0.00–0.07)
Basophils Absolute: 0 10*3/uL (ref 0.0–0.1)
Basophils Relative: 0 %
Eosinophils Absolute: 0 10*3/uL (ref 0.0–0.5)
Eosinophils Relative: 0 %
HCT: 38.6 % (ref 36.0–46.0)
Hemoglobin: 12.6 g/dL (ref 12.0–15.0)
Immature Granulocytes: 1 %
Lymphocytes Relative: 15 %
Lymphs Abs: 1.4 10*3/uL (ref 0.7–4.0)
MCH: 25.4 pg — ABNORMAL LOW (ref 26.0–34.0)
MCHC: 32.6 g/dL (ref 30.0–36.0)
MCV: 77.7 fL — ABNORMAL LOW (ref 80.0–100.0)
Monocytes Absolute: 0.8 10*3/uL (ref 0.1–1.0)
Monocytes Relative: 9 %
Neutro Abs: 6.8 10*3/uL (ref 1.7–7.7)
Neutrophils Relative %: 75 %
Platelets: 364 10*3/uL (ref 150–400)
RBC: 4.97 MIL/uL (ref 3.87–5.11)
RDW: 14.5 % (ref 11.5–15.5)
WBC: 9.1 10*3/uL (ref 4.0–10.5)
nRBC: 0 % (ref 0.0–0.2)

## 2020-07-08 LAB — CULTURE, BLOOD (ROUTINE X 2): Special Requests: ADEQUATE

## 2020-07-08 MED ORDER — SENNOSIDES-DOCUSATE SODIUM 8.6-50 MG PO TABS
1.0000 | ORAL_TABLET | Freq: Two times a day (BID) | ORAL | Status: DC
  Administered 2020-07-08: 1 via ORAL
  Filled 2020-07-08: qty 1

## 2020-07-08 MED ORDER — CEFDINIR 300 MG PO CAPS: 300.0000 mg | ORAL_CAPSULE | Freq: Two times a day (BID) | ORAL | 0 refills | Status: AC

## 2020-07-08 MED ORDER — CEFDINIR 300 MG PO CAPS
300.0000 mg | ORAL_CAPSULE | Freq: Two times a day (BID) | ORAL | Status: DC
  Administered 2020-07-08: 300 mg via ORAL
  Filled 2020-07-08 (×2): qty 1

## 2020-07-08 NOTE — Discharge Instructions (Signed)
Pyelonephritis, Adult °Pyelonephritis is an infection that occurs in the kidney. The kidneys are the organs that filter a person's blood and move waste out of the bloodstream and into the urine. Urine passes from the kidneys, through tubes called ureters, and into the bladder. There are two main types of pyelonephritis: °· Infections that come on quickly without any warning (acute pyelonephritis). °· Infections that last for a long period of time (chronic pyelonephritis). °In most cases, the infection clears up with treatment and does not cause further problems. More severe infections or chronic infections can sometimes spread to the bloodstream or lead to other problems with the kidneys. °What are the causes? °This condition is usually caused by: °· Bacteria traveling from the bladder up to the kidney. This may occur after having a bladder infection (cystitis) or urinary tract infection (UTI). °· Bladder infections caused from bacteria traveling from the bloodstream to the kidney. °What increases the risk? °This condition is more likely to develop in: °· Pregnant women. °· Older people. °· People who have any of these conditions: °? Diabetes. °? Inflammation of the prostate gland (prostatitis), in males. °? Kidney stones or bladder stones. °? Other abnormalities of the kidney or ureter. °? Cancer. °· People who have a catheter placed in the bladder. °· People who are sexually active. °· Women who use spermicides. °· People who have had a prior UTI. °What are the signs or symptoms? °Symptoms of this condition include: °· Frequent urination. °· Strong or persistent urge to urinate. °· Burning or stinging when urinating. °· Abdominal pain. °· Back pain. °· Pain in the side or flank area. °· Fever or chills. °· Blood in the urine, or dark urine. °· Nausea or vomiting. °How is this diagnosed? °This condition may be diagnosed based on: °· Your medical history and a physical exam. °· Urine tests. °· Blood tests. °You may  also have imaging tests of the kidneys, such as an ultrasound or CT scan. °How is this treated? °Treatment for this condition may depend on the severity of the infection. °· If the infection is mild and is found early, you may be treated with antibiotic medicines taken by mouth (orally). You will need to drink fluids to remain hydrated. °· If the infection is more severe, you may need to stay in the hospital and receive antibiotics given directly into a vein through an IV. You may also need to receive fluids through an IV if you are not able to remain hydrated. After your hospital stay, you may need to take oral antibiotics for a period of time. °Other treatments may be required, depending on the cause of the infection. °Follow these instructions at home: °Medicines °· Take your antibiotic medicine as told by your health care provider. Do not stop taking the antibiotic even if you start to feel better. °· Take over-the-counter and prescription medicines only as told by your health care provider. °General instructions ° °· Drink enough fluid to keep your urine pale yellow. °· Avoid caffeine, tea, and carbonated beverages. They tend to irritate the bladder. °· Urinate often. Avoid holding in urine for long periods of time. °· Urinate before and after sex. °· After a bowel movement, women should cleanse from front to back. Use each tissue only once. °· Keep all follow-up visits as told by your health care provider. This is important. °Contact a health care provider if: °· Your symptoms do not get better after 2 days of treatment. °· Your symptoms get worse. °·   You have a fever. °Get help right away if you: °· Are unable to take your antibiotics or fluids. °· Have shaking chills. °· Vomit. °· Have severe flank or back pain. °· Have extreme weakness or fainting. °Summary °· Pyelonephritis is a urinary tract infection (UTI) that occurs in the kidney. °· Treatment for this condition may depend on the severity of the  infection. °· Take your antibiotic medicine as told by your health care provider. Do not stop taking the antibiotic even if you start to feel better. °· Drink enough fluid to keep your urine pale yellow. °· Keep all follow-up visits as told by your health care provider. This is important. °This information is not intended to replace advice given to you by your health care provider. Make sure you discuss any questions you have with your health care provider. °Document Revised: 06/23/2018 Document Reviewed: 06/23/2018 °Elsevier Patient Education © 2020 [employer] ° °

## 2020-07-08 NOTE — Discharge Summary (Signed)
Physician Discharge Summary  Jennifer Mueller HUD:[id number] DOB: [birth year] DOA: 07/05/2020  PCP: Cathie Hoops, PA  Admit date: 07/05/2020 Discharge date: 07/08/2020  Admitted From: Home Disposition: Home  Recommendations for Outpatient Follow-up:  1. Follow up with PCP in 1-2 weeks   Home Health: No Equipment/Devices: None Discharge Condition: Stable CODE STATUS: Full Diet recommendation: Heart Healthy / Carb Modified  Brief/Interim Summary: 54 year old female with past medical history significant for hypertension, hyperlipidemia, migraines, obesity who presented to the emergency department with chief complaint of abdominal pain, nausea, vomiting  Patient was noted to have urinalysis consistent with infection.  Follow-up imaging demonstrated perinephric and periureter fat stranding consistent with acute pyelonephritis.  Blood cultures taken on admission positive for Klebsiella species.  Sensitivities pending.  Patient continues to clinically improve.  Blood cultures now positive for Klebsiella pneumonia.    Pansensitive.  Transition to p.o. cefdinir to complete total 14-day antibiotic course.  Discharge Diagnoses:  Principal Problem:   Pyelonephritis Active Problems:   Essential hypertension   Hyperlipidemia   Obesity, Class III, BMI 40-49.9 (morbid obesity) (HCC)   Severe sepsis with acute organ dysfunction due to Gram negative bacteria (HCC)  Klebsiella bacteremia Bilateral pyelonephritis Severe sepsis secondary to above Patient meets sepsis criteria with a leukocytosis and temperature and tachycardia.  Source is urinary with bacteremic extension. Initial blood cultures positive for Klebsiella, sensitivities pending Patient symptoms improved after initiation of antibiotics and intravenous fluids Sepsis physiology resolved the time of discharge Plan: DC IV Rocephin Start p.o. cefdinir Complete total 14-day antibiotic course Stable for discharge  Acute kidney  injury, resolved Likely in the setting of infection Improving over interval No evidence of chronic kidney disease at this time Baseline at time of discharge  Intractable nausea and vomiting Resolved the time of discharge Patient tolerating p.o.  Diarrhea No diarrhea since admission  Hypokalemia Hypernatremia Secondary to GI losses Stable at discharge  Hypertension Continue amlodipine 5 mg daily  metoprolol 100 mg daily per home dose   Sinus tachycardia  improved Appears related to infection and volume depletion Continue metoprolol per home dose  Hyperlipidemia Continue home statin  Obesity BMI 45.76 This complicates overall prognosis and care   Discharge Instructions  Discharge Instructions    Diet - low sodium heart healthy   Complete by: As directed    Increase activity slowly   Complete by: As directed      Allergies as of 07/08/2020   No Known Allergies     Medication List    TAKE these medications   amLODipine 5 MG tablet Commonly known as: NORVASC Take 5 mg by mouth daily.   busPIRone 10 MG tablet Commonly known as: BUSPAR Take 10 mg by mouth 2 (two) times daily.   cefdinir 300 MG capsule Commonly known as: OMNICEF Take 1 capsule (300 mg total) by mouth every 12 (twelve) hours for 10 days.   fluticasone 50 MCG/ACT nasal spray Commonly known as: FLONASE Place 2 sprays into both nostrils daily.   levonorgestrel 20 MCG/24HR IUD Commonly known as: MIRENA 1 each by Intrauterine route once.   lisinopril-hydrochlorothiazide 20-12.5 MG tablet Commonly known as: ZESTORETIC Take 1 tablet by mouth daily.   meloxicam 15 MG tablet Commonly known as: MOBIC Take 15 mg by mouth daily.   metoprolol tartrate 50 MG tablet Commonly known as: LOPRESSOR Take 100 mg by mouth every morning.   phentermine 15 MG capsule Take 15 mg by mouth daily.   potassium chloride 10 MEQ tablet Commonly known  as: KLOR-CON Take 10 mEq by mouth daily.    pravastatin 40 MG tablet Commonly known as: PRAVACHOL Take 40 mg by mouth daily.   topiramate 50 MG tablet Commonly known as: TOPAMAX Take 50 mg by mouth daily.   valACYclovir 1000 MG tablet Commonly known as: VALTREX Take 1,000 mg by mouth as needed (outbreak).       Follow-up Information    Cathie Hoops, [place]. Schedule an appointment as soon as possible for a visit in 1 week(s).   Specialty: Family Medicine Contact information: [address] [place] [address] [phone]              No Known Allergies  Consultations:  None   Procedures/Studies: CT ABDOMEN PELVIS W CONTRAST  Result Date: 07/05/2020 CLINICAL DATA:  Elevated WBC, abdominal pain EXAM: CT ABDOMEN AND PELVIS WITH CONTRAST TECHNIQUE: Multidetector CT imaging of the abdomen and pelvis was performed using the standard protocol following bolus administration of intravenous contrast. CONTRAST:  75mL OMNIPAQUE IOHEXOL 300 MG/ML  SOLN COMPARISON:  None. FINDINGS: Lower chest: Mild bibasilar atelectasis. Hepatobiliary: No focal liver abnormality is seen. No gallstones, gallbladder wall thickening, or biliary dilatation. Pancreas: Unremarkable. Spleen: Unremarkable. Adrenals/Urinary Tract: Adrenals are unremarkable. There is bilateral perinephric and periureteric stranding. Mild bilateral hydroureteronephrosis. There is urothelial enhancement the ureters and renal collecting systems. Bladder is unremarkable. Stomach/Bowel: Small hiatal hernia.  Bowel is normal in caliber. Vascular/Lymphatic: No significant vascular findings are present. No enlarged lymph nodes identified. Reproductive: Intrauterine device is present.  No adnexal mass. Other: No ascites.  No acute abnormality of the abdominal wall. Musculoskeletal: Lower lumbar spine degenerative changes. IMPRESSION: Findings suspicious for bilateral ascending urinary tract infection. No obstructing stone. Electronically Signed   By: Guadlupe Spanish M.D.   On: 07/05/2020 12:18    (Echo, Carotid, EGD, Colonoscopy, ERCP)    Subjective: Seen and examined the day of discharge.  Stable, no distress.  Tolerating p.o.  No fevers noted over interval.  Kidney function and electrolytes stabilize.  Stable for discharge home.  Discharge Exam: Vitals:   07/08/20 0503 07/08/20 0745  BP: (!) 147/80 (!) 156/94  Pulse: 99 83  Resp: 16 20  Temp: 98.1 F (36.7 C) 98.6 F (37 C)  SpO2: 97% 97%   Vitals:   07/07/20 2021 07/07/20 2344 07/08/20 0503 07/08/20 0745  BP: (!) 146/98 (!) 150/99 (!) 147/80 (!) 156/94  Pulse: (!) 109 95 99 83  Resp: 16 18 16 20   Temp: 97.8 F (36.6 C) 98.2 F (36.8 C) 98.1 F (36.7 C) 98.6 F (37 C)  TempSrc: Oral Oral Oral Oral  SpO2: 97% 99% 97% 97%  Weight:      Height:        General: Pt is alert, awake, not in acute distress Cardiovascular: RRR, S1/S2 +, no rubs, no gallops Respiratory: CTA bilaterally, no wheezing, no rhonchi Abdominal: Soft, NT, ND, bowel sounds + Extremities: no edema, no cyanosis    The results of significant diagnostics from this hospitalization (including imaging, microbiology, ancillary and laboratory) are listed below for reference.     Microbiology: Recent Results (from the past 240 hour(s))  Blood culture (routine x 2)     Status: Abnormal   Collection Time: 07/05/20 11:54 AM   Specimen: BLOOD  Result Value Ref Range Status   Specimen Description   Final    BLOOD LEFT ANTECUBITAL Performed at [facility], [address] [place], [place] [address]  Special Requests   Final    BOTTLES DRAWN AEROBIC AND ANAEROBIC Blood Culture adequate volume Performed at [facility], [address] [place], [place] [address]    Culture  Setup Time   Final    GRAM NEGATIVE RODS IN BOTH AEROBIC AND ANAEROBIC BOTTLES CRITICAL RESULT CALLED TO, READ BACK BY AND VERIFIED WITH: Princella IonJASON ROBBINS RN [id number] 07/06/20 HNM Performed at [facility], [address] [place], [place] [address]    Culture KLEBSIELLA PNEUMONIAE (A)  Final   Report Status 07/08/2020 FINAL  Final   Organism ID, Bacteria KLEBSIELLA PNEUMONIAE  Final      Susceptibility   Klebsiella pneumoniae - MIC*    AMPICILLIN >=32 RESISTANT Resistant     CEFAZOLIN <=4 SENSITIVE Sensitive     CEFEPIME <=0.12 SENSITIVE Sensitive     CEFTAZIDIME <=1 SENSITIVE Sensitive     CEFTRIAXONE <=0.25 SENSITIVE Sensitive     CIPROFLOXACIN <=0.25 SENSITIVE Sensitive     GENTAMICIN <=1 SENSITIVE Sensitive     IMIPENEM <=0.25 SENSITIVE Sensitive     TRIMETH/SULFA <=20 SENSITIVE Sensitive     AMPICILLIN/SULBACTAM 4 SENSITIVE Sensitive     PIP/TAZO <=4 SENSITIVE Sensitive     * KLEBSIELLA PNEUMONIAE  Blood culture (routine x 2)     Status: Abnormal   Collection Time: 07/05/20 11:54 AM   Specimen: BLOOD  Result Value Ref Range Status   Specimen Description   Final    BLOOD BLOOD RIGHT HAND Performed at [facility], [address] [place], [place] [address]    Special Requests   Final    BOTTLES DRAWN AEROBIC AND ANAEROBIC Blood Culture results may not be optimal due to an inadequate volume of blood received in culture bottles Performed at [facility], [address] [place], [place] [address]    Culture  Setup Time   Final    GRAM NEGATIVE RODS IN BOTH AEROBIC AND ANAEROBIC BOTTLES CRITICAL VALUE NOTED.  VALUE IS CONSISTENT WITH PREVIOUSLY REPORTED AND CALLED VALUE. Performed at [facility], [address] [place], [place] [address]    Culture (A)  Final    KLEBSIELLA PNEUMONIAE SUSCEPTIBILITIES PERFORMED ON PREVIOUS CULTURE WITHIN THE LAST 5 DAYS. Performed at [facility], [address] [place], [place] [address]    Report Status 07/08/2020 FINAL  Final  Blood Culture ID Panel (Reflexed)     Status: Abnormal   Collection Time: 07/05/20 11:54 AM  Result Value Ref Range Status   Enterococcus faecalis NOT DETECTED NOT DETECTED Final    Enterococcus Faecium NOT DETECTED NOT DETECTED Final   Listeria monocytogenes NOT DETECTED NOT DETECTED Final   Staphylococcus species NOT DETECTED NOT DETECTED Final   Staphylococcus aureus (BCID) NOT DETECTED NOT DETECTED Final   Staphylococcus epidermidis NOT DETECTED NOT DETECTED Final   Staphylococcus lugdunensis NOT DETECTED NOT DETECTED Final   Streptococcus species NOT DETECTED NOT DETECTED Final   Streptococcus agalactiae NOT DETECTED NOT DETECTED Final   Streptococcus pneumoniae NOT DETECTED NOT DETECTED Final   Streptococcus pyogenes NOT DETECTED NOT DETECTED Final   A.calcoaceticus-baumannii NOT DETECTED NOT DETECTED Final   Bacteroides fragilis NOT DETECTED NOT DETECTED Final   Enterobacterales DETECTED (A) NOT DETECTED Final    Comment: Enterobacterales represent a large order of gram negative bacteria, not a single organism. CRITICAL RESULT CALLED TO, READ BACK BY AND VERIFIED WITH: Princella IonJASON ROBBINS [facility] [id number] 07/06/20 HNM    Enterobacter cloacae complex NOT DETECTED NOT DETECTED Final  Escherichia coli NOT DETECTED NOT DETECTED Final   Klebsiella aerogenes NOT DETECTED NOT DETECTED Final   Klebsiella oxytoca NOT DETECTED NOT DETECTED Final   Klebsiella pneumoniae DETECTED (A) NOT DETECTED Final    Comment: CRITICAL RESULT CALLED TO, READ BACK BY AND VERIFIED WITH: Princella Ion PHARMD [id number] 07/06/20 HNM    Proteus species NOT DETECTED NOT DETECTED Final   Salmonella species NOT DETECTED NOT DETECTED Final   Serratia marcescens NOT DETECTED NOT DETECTED Final   Haemophilus influenzae NOT DETECTED NOT DETECTED Final   Neisseria meningitidis NOT DETECTED NOT DETECTED Final   Pseudomonas aeruginosa NOT DETECTED NOT DETECTED Final   Stenotrophomonas maltophilia NOT DETECTED NOT DETECTED Final   Candida albicans NOT DETECTED NOT DETECTED Final   Candida auris NOT DETECTED NOT DETECTED Final   Candida glabrata NOT DETECTED NOT DETECTED Final   Candida krusei NOT DETECTED NOT  DETECTED Final   Candida parapsilosis NOT DETECTED NOT DETECTED Final   Candida tropicalis NOT DETECTED NOT DETECTED Final   Cryptococcus neoformans/gattii NOT DETECTED NOT DETECTED Final   CTX-M ESBL NOT DETECTED NOT DETECTED Final   Carbapenem resistance IMP NOT DETECTED NOT DETECTED Final   Carbapenem resistance KPC NOT DETECTED NOT DETECTED Final   Carbapenem resistance NDM NOT DETECTED NOT DETECTED Final   Carbapenem resist OXA 48 LIKE NOT DETECTED NOT DETECTED Final   Carbapenem resistance VIM NOT DETECTED NOT DETECTED Final    Comment: Performed at [facility], [address] [place], [place] [address]  Respiratory Panel by RT PCR (Flu A&B, Covid) - Nasopharyngeal Swab     Status: None   Collection Time: 07/05/20 12:54 PM   Specimen: Nasopharyngeal Swab  Result Value Ref Range Status   SARS Coronavirus 2 by RT PCR NEGATIVE NEGATIVE Final    Comment: (NOTE) SARS-CoV-2 target nucleic acids are NOT DETECTED.  The SARS-CoV-2 RNA is generally detectable in upper respiratoy specimens during the acute phase of infection. The lowest concentration of SARS-CoV-2 viral copies this assay can detect is 131 copies/mL. A negative result does not preclude SARS-Cov-2 infection and should not be used as the sole basis for treatment or other patient management decisions. A negative result may occur with  improper specimen collection/handling, submission of specimen other than nasopharyngeal swab, presence of viral mutation(s) within the areas targeted by this assay, and inadequate number of viral copies (<131 copies/mL). A negative result must be combined with clinical observations, patient history, and epidemiological information. The expected result is Negative.  Fact Sheet for Patients:  https://www.moore.com/  Fact Sheet for Healthcare Providers:  https://www.young.biz/  This test is no t yet approved or cleared by the [place] FDA  and  has been authorized for detection and/or diagnosis of SARS-CoV-2 by FDA under an Emergency Use Authorization (EUA). This EUA will remain  in effect (meaning this test can be used) for the duration of the COVID-19 declaration under Section 564(b)(1) of the Act, 21 U.S.C. section 360bbb-3(b)(1), unless the authorization is terminated or revoked sooner.     Influenza A by PCR NEGATIVE NEGATIVE Final   Influenza B by PCR NEGATIVE NEGATIVE Final    Comment: (NOTE) The Xpert Xpress SARS-CoV-2/FLU/RSV assay is intended as an aid in  the diagnosis of influenza from Nasopharyngeal swab specimens and  should not be used as a sole basis for treatment. Nasal washings and  aspirates are unacceptable for Xpert Xpress SARS-CoV-2/FLU/RSV  testing.  Fact Sheet for Patients: https://www.moore.com/  Fact Sheet for Healthcare Providers: https://www.young.biz/  This test is not yet approved or cleared by the [place] and  has been authorized for detection and/or diagnosis of SARS-CoV-2 by  FDA under an Emergency Use Authorization (EUA). This EUA will remain  in effect (meaning this test can be used) for the duration of the  Covid-19 declaration under Section 564(b)(1) of the Act, 21  U.S.C. section 360bbb-3(b)(1), unless the authorization is  terminated or revoked. Performed at [facility], [address] [place], [place] [address]      Labs: BNP (last 3 results) No results for input(s): BNP in the last 8760 hours. Basic Metabolic Panel: Recent Labs  Lab 07/05/20 0839 07/05/20 1812 07/06/20 0601 07/07/20 0810 07/08/20 0657  NA 129*  --  133* 132* 131*  K 2.9*  --  3.3* 3.4* 3.2*  CL 88*  --  93* 95* 95*  CO2 29  --  GLUCOSE 208*  --  101* 98 106*  BUN 16  --  CREATININE 1.51*  --  1.17* 0.94 1.00  CALCIUM 8.5*  --  8.2* 8.7* 8.9  MG  --  2.3  --   --   --    Liver Function Tests: Recent Labs  Lab  07/05/20 0839  AST 36  ALT 37  ALKPHOS 114  BILITOT 2.3*  PROT 7.7  ALBUMIN 2.6*   Recent Labs  Lab 07/05/20 0839  LIPASE 25   No results for input(s): AMMONIA in the last 168 hours. CBC: Recent Labs  Lab 07/05/20 0839 07/06/20 0601 07/07/20 0810 07/08/20 0657  WBC 18.2* 14.7* 11.2* 9.1  NEUTROABS  --   --  8.6* 6.8  HGB 13.2 12.1 12.1 12.6  HCT 40.0 36.7 37.3 38.6  MCV 77.7* 77.9* 78.4* 77.7*  PLT 287 263 300 364   Cardiac Enzymes: No results for input(s): CKTOTAL, CKMB, CKMBINDEX, TROPONINI in the last 168 hours. BNP: Invalid input(s): POCBNP CBG: No results for input(s): GLUCAP in the last 168 hours. D-Dimer No results for input(s): DDIMER in the last 72 hours. Hgb A1c No results for input(s): HGBA1C in the last 72 hours. Lipid Profile No results for input(s): CHOL, HDL, LDLCALC, TRIG, CHOLHDL, LDLDIRECT in the last 72 hours. Thyroid function studies No results for input(s): TSH, T4TOTAL, T3FREE, THYROIDAB in the last 72 hours.  Invalid input(s): FREET3 Anemia work up No results for input(s): VITAMINB12, FOLATE, FERRITIN, TIBC, IRON, RETICCTPCT in the last 72 hours. Urinalysis    Component Value Date/Time   COLORURINE YELLOW (A) 07/05/2020 0839   APPEARANCEUR CLOUDY (A) 07/05/2020 0839   LABSPEC 1.006 07/05/2020 0839   PHURINE 6.0 07/05/2020 0839   GLUCOSEU NEGATIVE 07/05/2020 0839   HGBUR LARGE (A) 07/05/2020 0839   BILIRUBINUR NEGATIVE 07/05/2020 0839   KETONESUR NEGATIVE 07/05/2020 0839   PROTEINUR 30 (A) 07/05/2020 0839   NITRITE POSITIVE (A) 07/05/2020 0839   LEUKOCYTESUR LARGE (A) 07/05/2020 0839   Sepsis Labs Invalid input(s): PROCALCITONIN,  WBC,  LACTICIDVEN Microbiology Recent Results (from the past 240 hour(s))  Blood culture (routine x 2)     Status: Abnormal   Collection Time: 07/05/20 11:54 AM   Specimen: BLOOD  Result Value Ref Range Status   Specimen Description   Final    BLOOD LEFT ANTECUBITAL Performed at [facility], [address] [place], [place] [address]    Special Requests   Final    BOTTLES DRAWN AEROBIC AND ANAEROBIC Blood Culture adequate volume Performed at  [facility], [address] [place], [place] [address]    Culture  Setup Time   Final    GRAM NEGATIVE RODS IN BOTH AEROBIC AND ANAEROBIC BOTTLES CRITICAL RESULT CALLED TO, READ BACK BY AND VERIFIED WITH: Princella Ion RN [id number] 07/06/20 HNM Performed at [facility], [address] [place], [place] [address]    Culture KLEBSIELLA PNEUMONIAE (A)  Final   Report Status 07/08/2020 FINAL  Final   Organism ID, Bacteria KLEBSIELLA PNEUMONIAE  Final      Susceptibility   Klebsiella pneumoniae - MIC*    AMPICILLIN >=32 RESISTANT Resistant     CEFAZOLIN <=4 SENSITIVE Sensitive     CEFEPIME <=0.12 SENSITIVE Sensitive     CEFTAZIDIME <=1 SENSITIVE Sensitive     CEFTRIAXONE <=0.25 SENSITIVE Sensitive     CIPROFLOXACIN <=0.25 SENSITIVE Sensitive     GENTAMICIN <=1 SENSITIVE Sensitive     IMIPENEM <=0.25 SENSITIVE Sensitive     TRIMETH/SULFA <=20 SENSITIVE Sensitive     AMPICILLIN/SULBACTAM 4 SENSITIVE Sensitive     PIP/TAZO <=4 SENSITIVE Sensitive     * KLEBSIELLA PNEUMONIAE  Blood culture (routine x 2)     Status: Abnormal   Collection Time: 07/05/20 11:54 AM   Specimen: BLOOD  Result Value Ref Range Status   Specimen Description   Final    BLOOD BLOOD RIGHT HAND Performed at [facility], [address], [place], [place] [address]    Special Requests   Final    BOTTLES DRAWN AEROBIC AND ANAEROBIC Blood Culture results may not be optimal due to an inadequate volume of blood received in culture bottles Performed at [facility], [address] [place], [place] [address]    Culture  Setup Time   Final    GRAM NEGATIVE RODS IN BOTH AEROBIC AND ANAEROBIC BOTTLES CRITICAL VALUE NOTED.  VALUE IS CONSISTENT WITH PREVIOUSLY REPORTED AND CALLED VALUE. Performed at [facility], [address] [place], [place] [address]    Culture (A)  Final    KLEBSIELLA PNEUMONIAE SUSCEPTIBILITIES PERFORMED ON PREVIOUS CULTURE WITHIN THE LAST 5 DAYS. Performed at [facility], [address], , [place] [address]    Report Status 07/08/2020 FINAL  Final  Blood Culture ID Panel (Reflexed)     Status: Abnormal   Collection Time: 07/05/20 11:54 AM  Result Value Ref Range Status   Enterococcus faecalis NOT DETECTED NOT DETECTED Final   Enterococcus Faecium NOT DETECTED NOT DETECTED Final   Listeria monocytogenes NOT DETECTED NOT DETECTED Final   Staphylococcus species NOT DETECTED NOT DETECTED Final   Staphylococcus aureus (BCID) NOT DETECTED NOT DETECTED Final   Staphylococcus epidermidis NOT DETECTED NOT DETECTED Final   Staphylococcus lugdunensis NOT DETECTED NOT DETECTED Final   Streptococcus species NOT DETECTED NOT DETECTED Final   Streptococcus agalactiae NOT DETECTED NOT DETECTED Final   Streptococcus pneumoniae NOT DETECTED NOT DETECTED Final   Streptococcus pyogenes NOT DETECTED NOT DETECTED Final   A.calcoaceticus-baumannii NOT DETECTED NOT DETECTED Final   Bacteroides fragilis NOT DETECTED NOT DETECTED Final   Enterobacterales DETECTED (A) NOT DETECTED Final    Comment: Enterobacterales represent a large order of gram negative bacteria, not a single organism. CRITICAL RESULT CALLED TO, READ BACK BY AND VERIFIED WITH: Princella Ion PHAMRD [id number] 07/06/20 HNM    Enterobacter cloacae complex NOT DETECTED NOT DETECTED Final   Escherichia coli NOT DETECTED NOT DETECTED Final   Klebsiella aerogenes NOT DETECTED NOT DETECTED Final  Klebsiella oxytoca NOT DETECTED NOT DETECTED Final   Klebsiella pneumoniae DETECTED (A) NOT DETECTED Final    Comment: CRITICAL RESULT CALLED TO, READ BACK BY AND VERIFIED WITH: Princella Ion PHARMD [id number] 07/06/20 HNM    Proteus species NOT DETECTED NOT DETECTED Final   Salmonella species NOT DETECTED NOT DETECTED Final   Serratia marcescens  NOT DETECTED NOT DETECTED Final   Haemophilus influenzae NOT DETECTED NOT DETECTED Final   Neisseria meningitidis NOT DETECTED NOT DETECTED Final   Pseudomonas aeruginosa NOT DETECTED NOT DETECTED Final   Stenotrophomonas maltophilia NOT DETECTED NOT DETECTED Final   Candida albicans NOT DETECTED NOT DETECTED Final   Candida auris NOT DETECTED NOT DETECTED Final   Candida glabrata NOT DETECTED NOT DETECTED Final   Candida krusei NOT DETECTED NOT DETECTED Final   Candida parapsilosis NOT DETECTED NOT DETECTED Final   Candida tropicalis NOT DETECTED NOT DETECTED Final   Cryptococcus neoformans/gattii NOT DETECTED NOT DETECTED Final   CTX-M ESBL NOT DETECTED NOT DETECTED Final   Carbapenem resistance IMP NOT DETECTED NOT DETECTED Final   Carbapenem resistance KPC NOT DETECTED NOT DETECTED Final   Carbapenem resistance NDM NOT DETECTED NOT DETECTED Final   Carbapenem resist OXA 48 LIKE NOT DETECTED NOT DETECTED Final   Carbapenem resistance VIM NOT DETECTED NOT DETECTED Final    Comment: Performed at [facility], [address] [place], [place] [address]  Respiratory Panel by RT PCR (Flu A&B, Covid) - Nasopharyngeal Swab     Status: None   Collection Time: 07/05/20 12:54 PM   Specimen: Nasopharyngeal Swab  Result Value Ref Range Status   SARS Coronavirus 2 by RT PCR NEGATIVE NEGATIVE Final    Comment: (NOTE) SARS-CoV-2 target nucleic acids are NOT DETECTED.  The SARS-CoV-2 RNA is generally detectable in upper respiratoy specimens during the acute phase of infection. The lowest concentration of SARS-CoV-2 viral copies this assay can detect is 131 copies/mL. A negative result does not preclude SARS-Cov-2 infection and should not be used as the sole basis for treatment or other patient management decisions. A negative result may occur with  improper specimen collection/handling, submission of specimen other than nasopharyngeal swab, presence of viral mutation(s) within  the areas targeted by this assay, and inadequate number of viral copies (<131 copies/mL). A negative result must be combined with clinical observations, patient history, and epidemiological information. The expected result is Negative.  Fact Sheet for Patients:  https://www.moore.com/  Fact Sheet for Healthcare Providers:  https://www.young.biz/  This test is no t yet approved or cleared by the [place] FDA and  has been authorized for detection and/or diagnosis of SARS-CoV-2 by FDA under an Emergency Use Authorization (EUA). This EUA will remain  in effect (meaning this test can be used) for the duration of the COVID-19 declaration under Section 564(b)(1) of the Act, 21 U.S.C. section 360bbb-3(b)(1), unless the authorization is terminated or revoked sooner.     Influenza A by PCR NEGATIVE NEGATIVE Final   Influenza B by PCR NEGATIVE NEGATIVE Final    Comment: (NOTE) The Xpert Xpress SARS-CoV-2/FLU/RSV assay is intended as an aid in  the diagnosis of influenza from Nasopharyngeal swab specimens and  should not be used as a sole basis for treatment. Nasal washings and  aspirates are unacceptable for Xpert Xpress SARS-CoV-2/FLU/RSV  testing.  Fact Sheet for Patients: https://www.moore.com/  Fact Sheet for Healthcare Providers: https://www.young.biz/  This test is not yet approved or cleared by the [place] and  has been authorized  for detection and/or diagnosis of SARS-CoV-2 by  FDA under an Emergency Use Authorization (EUA). This EUA will remain  in effect (meaning this test can be used) for the duration of the  Covid-19 declaration under Section 564(b)(1) of the Act, 21  U.S.C. section 360bbb-3(b)(1), unless the authorization is  terminated or revoked. Performed at [facility], [address], [place], [place] [address]      Time coordinating discharge: Over 30  minutes  SIGNED:   Tresa Moore, MD  [facility] 07/08/2020, 1:08 PM Pager   If 7PM-7AM, please contact night-coverage

## 2020-08-07 ENCOUNTER — Other Ambulatory Visit: Payer: Self-pay

## 2020-08-07 ENCOUNTER — Encounter: Payer: Self-pay | Admitting: Obstetrics and Gynecology

## 2020-08-07 ENCOUNTER — Ambulatory Visit (INDEPENDENT_AMBULATORY_CARE_PROVIDER_SITE_OTHER): Payer: BC Managed Care – PPO | Admitting: Obstetrics and Gynecology

## 2020-08-07 VITALS — BP 142/92 | Ht 65.0 in | Wt 239.0 lb

## 2020-08-07 DIAGNOSIS — Z1239 Encounter for other screening for malignant neoplasm of breast: Secondary | ICD-10-CM | POA: Diagnosis not present

## 2020-08-07 DIAGNOSIS — N12 Tubulo-interstitial nephritis, not specified as acute or chronic: Secondary | ICD-10-CM

## 2020-08-07 DIAGNOSIS — Z01419 Encounter for gynecological examination (general) (routine) without abnormal findings: Secondary | ICD-10-CM

## 2020-08-07 NOTE — Progress Notes (Signed)
Annual, want labs and urine culture. RM 5

## 2020-08-07 NOTE — Patient Instructions (Signed)
[facility] [address] [place] [address]  [facility] [place]  [address] [place] [address]  Phone: [phone]  Next due in February 2022

## 2020-08-07 NOTE — Progress Notes (Signed)
Gynecology Annual Exam  PCP: Cathie Hoops, PA  Chief Complaint:  Chief Complaint  Patient presents with  . Gynecologic Exam    Annual, want labs and urine culture. RM 5    History of Present Illness:Patient is a 54 y.o. G2P2002 presents for annual exam. The patient has no complaints today.   LMP: No LMP recorded. (Menstrual status: IUD). No bleeding on nexplanon  The patient is sexually active. She denies dyspareunia.  The patient does perform self breast exams.  There is no notable family history of breast or ovarian cancer in her family.  The patient wears seatbelts: yes.   The patient has regular exercise: not asked.    The patient denies current symptoms of depression.     Review of Systems: Review of Systems  Constitutional: Negative for chills and fever.  HENT: Negative for congestion.   Respiratory: Negative for cough and shortness of breath.   Cardiovascular: Negative for chest pain and palpitations.  Gastrointestinal: Negative for abdominal pain, constipation, diarrhea, heartburn, nausea and vomiting.  Genitourinary: Negative for dysuria, frequency and urgency.  Skin: Negative for itching and rash.  Neurological: Negative for dizziness and headaches.  Endo/Heme/Allergies: Negative for polydipsia.  Psychiatric/Behavioral: Negative for depression.    Past Medical History:  Patient Active Problem List   Diagnosis Date Noted  . Severe sepsis with acute organ dysfunction due to Gram negative bacteria (HCC) 07/06/2020  . Pyelonephritis 07/05/2020  . Essential hypertension 07/05/2020  . Hyperlipidemia 07/05/2020  . Obesity, Class III, BMI 40-49.9 (morbid obesity) (HCC) 07/05/2020  . Special screening for malignant neoplasms, colon     Past Surgical History:  Past Surgical History:  Procedure Laterality Date  . CATARACT EXTRACTION W/PHACO Right 01/22/2017   Procedure: CATARACT EXTRACTION PHACO AND INTRAOCULAR LENS PLACEMENT (IOC)  Right Complicated;   Surgeon: Lockie Mola, MD;  Location: [facility];  Service: Ophthalmology;  Laterality: Right;   healon 5 vision blue  . CERVICAL POLYPECTOMY  2015  . COLONOSCOPY WITH PROPOFOL N/A 07/14/2017   Procedure: COLONOSCOPY WITH PROPOFOL;  Surgeon: Midge Minium, MD;  Location: [facility];  Service: Endoscopy;  Laterality: N/A;  sleep apnea    Gynecologic History:  No LMP recorded. (Menstrual status: IUD). Last Pap: Results were: 06/17/2018 no abnormalities  Last mammogram: 10/15/2019 Results were: BI-RAD I  Contraception IUD 04/15/2014 Mirena and s/p prior BTL (Essure device)  Obstetric History: [id number]  Family History:  Family History  Problem Relation Age of Onset  . Breast cancer Sister 33  . Breast cancer Maternal Aunt 28    Social History:  Social History   Socioeconomic History  . Marital status: Married    Spouse name: Not on file  . Number of children: Not on file  . Years of education: Not on file  . Highest education level: Not on file  Occupational History  . Not on file  Tobacco Use  . Smoking status: Never Smoker  . Smokeless tobacco: Never Used  Vaping Use  . Vaping Use: Never used  Substance and Sexual Activity  . Alcohol use: No    Comment: may have 1 glass wine/month  . Drug use: No  . Sexual activity: Yes    Birth control/protection: I.U.D.  Other Topics Concern  . Not on file  Social History Narrative  . Not on file   Social Determinants of Health   Financial Resource Strain:   . Difficulty of Paying Living Expenses: Not on file  Food Insecurity:   .  Worried About Programme researcher, broadcasting/film/video in the Last Year: Not on file  . Ran Out of Food in the Last Year: Not on file  Transportation Needs:   . Lack of Transportation (Medical): Not on file  . Lack of Transportation (Non-Medical): Not on file  Physical Activity:   . Days of Exercise per Week: Not on file  . Minutes of Exercise per Session: Not on file  Stress:   . Feeling of  Stress : Not on file  Social Connections:   . Frequency of Communication with Friends and Family: Not on file  . Frequency of Social Gatherings with Friends and Family: Not on file  . Attends Religious Services: Not on file  . Active Member of Clubs or Organizations: Not on file  . Attends Banker Meetings: Not on file  . Marital Status: Not on file  Intimate Partner Violence:   . Fear of Current or Ex-Partner: Not on file  . Emotionally Abused: Not on file  . Physically Abused: Not on file  . Sexually Abused: Not on file    Allergies:  No Known Allergies  Medications: Prior to Admission medications   Medication Sig Start Date End Date Taking? Authorizing Provider  amLODipine (NORVASC) 5 MG tablet Take 5 mg by mouth daily.   Yes [provider]  busPIRone (BUSPAR) 10 MG tablet Take 10 mg by mouth 2 (two) times daily. 06/05/20  Yes [provider]  fluticasone (FLONASE) 50 MCG/ACT nasal spray Place 2 sprays into both nostrils daily. 01/19/17  Yes Cook, Verdis Frederickson, DO  levonorgestrel (MIRENA) 20 MCG/24HR IUD 1 each by Intrauterine route once.   Yes [provider]  lisinopril-hydrochlorothiazide (PRINZIDE,ZESTORETIC) 20-12.5 MG tablet Take 1 tablet by mouth daily.   Yes [provider]  meloxicam (MOBIC) 15 MG tablet Take 15 mg by mouth daily. 06/20/20  Yes [provider]  metoprolol (LOPRESSOR) 50 MG tablet Take 100 mg by mouth every morning.   Yes [provider]  phentermine 15 MG capsule Take 15 mg by mouth daily.   Yes [provider]  potassium chloride (KLOR-CON) 10 MEQ tablet Take 10 mEq by mouth daily. 05/15/20  Yes [provider]  pravastatin (PRAVACHOL) 40 MG tablet Take 40 mg by mouth daily.   Yes [provider]  topiramate (TOPAMAX) 50 MG tablet Take 50 mg by mouth daily.  03/03/17  Yes [provider]  valACYclovir (VALTREX) 1000 MG tablet Take 1,000 mg by mouth as needed  (outbreak).  04/30/16  Yes [provider]    Physical Exam Vitals: Blood pressure (!) 142/92, height 5\' 5"  (1.651 m), weight 239 lb (108.4 kg).  General: NAD HEENT: normocephalic, anicteric Thyroid: no enlargement, no palpable nodules Pulmonary: No increased work of breathing, CTAB Cardiovascular: RRR, distal pulses 2+ Breast: Breast symmetrical, no tenderness, no palpable nodules or masses, no skin or nipple retraction present, no nipple discharge.  No axillary or supraclavicular lymphadenopathy. Abdomen: NABS, soft, non-tender, non-distended.  Umbilicus without lesions.  No hepatomegaly, splenomegaly or masses palpable. No evidence of hernia  Genitourinary:  External: Normal external female genitalia.  Normal urethral meatus, normal Bartholin's and Skene's glands.    Vagina: Normal vaginal mucosa, no evidence of prolapse.    Cervix: Grossly normal in appearance, no bleeding, IUD string visualized 2cm  Uterus: Non-enlarged, mobile, normal contour.  No CMT  Adnexa: ovaries non-enlarged, no adnexal masses  Rectal: deferred  Lymphatic: no evidence of inguinal lymphadenopathy Extremities: no edema, erythema,  or tenderness Neurologic: Grossly intact Psychiatric: mood appropriate, affect full  Female chaperone present for pelvic and breast  portions of the physical exam  Immunization History  Administered Date(s) Administered  . Moderna SARS-COVID-2 Vaccination 10/30/2019, 11/27/2019     Assessment: 54 y.o. G2P2002 routine annual exam  Plan: Problem List Items Addressed This Visit      Genitourinary   Pyelonephritis   Relevant Orders   Urine Culture    Other Visit Diagnoses    Encounter for gynecological examination without abnormal finding    -  Primary   Breast screening       Relevant Orders   MM 3D SCREEN BREAST BILATERAL      1) Mammogram - recommend yearly screening mammogram.  Mammogram Is up to date  2) STI screening  was notoffered and therefore not  obtained  3) ASCCP guidelines and rational discussed.  Patient opts for every 3 years screening interval  4) Osteoporosis  - per USPTF routine screening DEXA at age 54  5) Routine healthcare maintenance including cholesterol, diabetes screening discussed managed by PCP  6) Colonoscopy  - UTD 2018  7) Pyelonephritis this year - surveillance urine culture requested and obtained  8) Return in about 1 year (around 08/07/2021) for annual.    Vena Austria, MD Domingo Pulse, [facility] 08/07/2020, 9:21 AM

## 2020-08-09 LAB — URINE CULTURE

## 2021-04-10 ENCOUNTER — Other Ambulatory Visit: Payer: Self-pay | Admitting: Family Medicine

## 2021-04-10 DIAGNOSIS — Z1231 Encounter for screening mammogram for malignant neoplasm of breast: Secondary | ICD-10-CM

## 2021-04-24 ENCOUNTER — Other Ambulatory Visit: Payer: Self-pay

## 2021-04-24 ENCOUNTER — Ambulatory Visit
Admission: RE | Admit: 2021-04-24 | Discharge: 2021-04-24 | Disposition: A | Discharge status: Home / Self Care | Payer: BC Managed Care – PPO | Source: Ambulatory Visit | Attending: Family Medicine | Admitting: Family Medicine

## 2021-04-24 DIAGNOSIS — Z1231 Encounter for screening mammogram for malignant neoplasm of breast: Secondary | ICD-10-CM | POA: Diagnosis not present

## 2022-03-11 ENCOUNTER — Other Ambulatory Visit: Payer: Self-pay | Admitting: Family Medicine

## 2022-03-11 DIAGNOSIS — Z1231 Encounter for screening mammogram for malignant neoplasm of breast: Secondary | ICD-10-CM

## 2022-05-02 ENCOUNTER — Inpatient Hospital Stay: Admission: RE | Admit: 2022-05-02 | Payer: BC Managed Care – PPO | Source: Ambulatory Visit

## 2022-06-27 IMAGING — MG MM DIGITAL SCREENING BILAT W/ TOMO AND CAD
8 series · 8 of 24 positions shown · non-contrast
Comparison: Previous exam(s).

CLINICAL DATA: Screening.

EXAM:
DIGITAL SCREENING BILATERAL MAMMOGRAM WITH TOMOSYNTHESIS AND CAD
TECHNIQUE: Bilateral screening digital craniocaudal and mediolateral oblique
mammograms were obtained. Bilateral screening digital breast
tomosynthesis was performed. The images were evaluated with
computer-aided detection.

[R CC synth-2D]
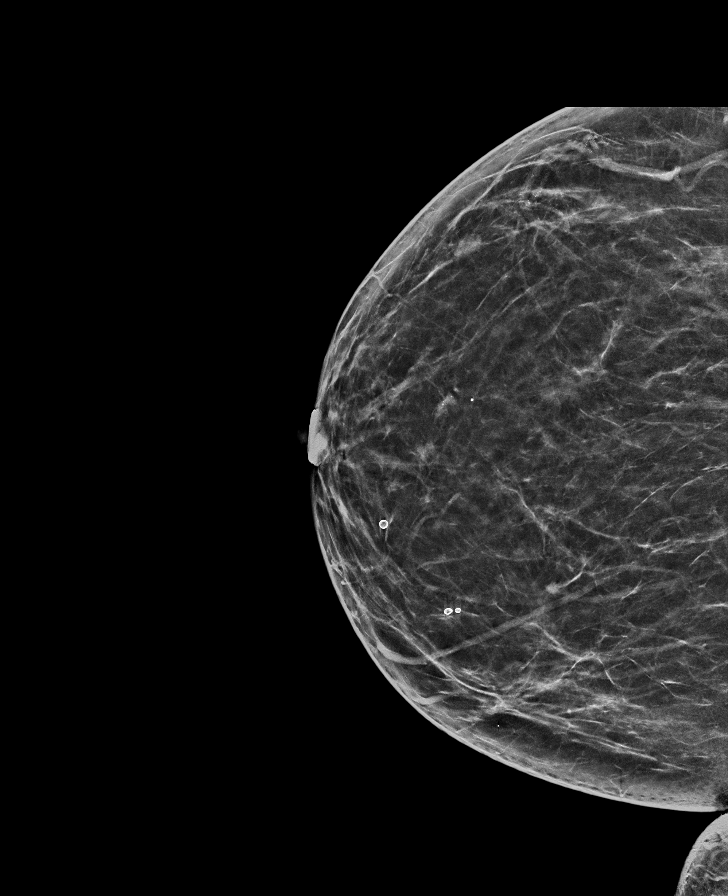

[L MLO synth-2D]
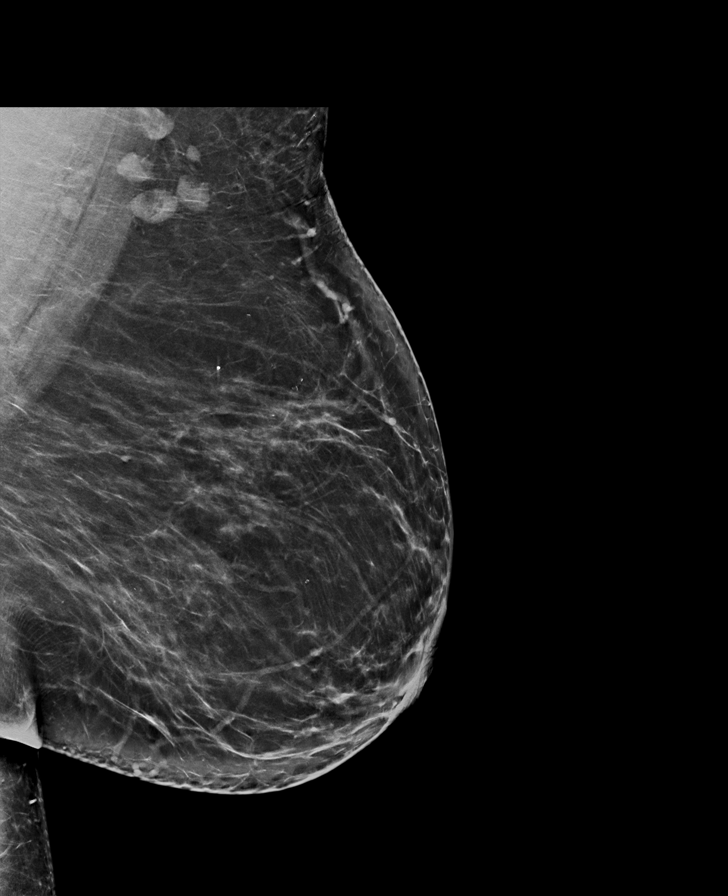

[L CC synth-2D]
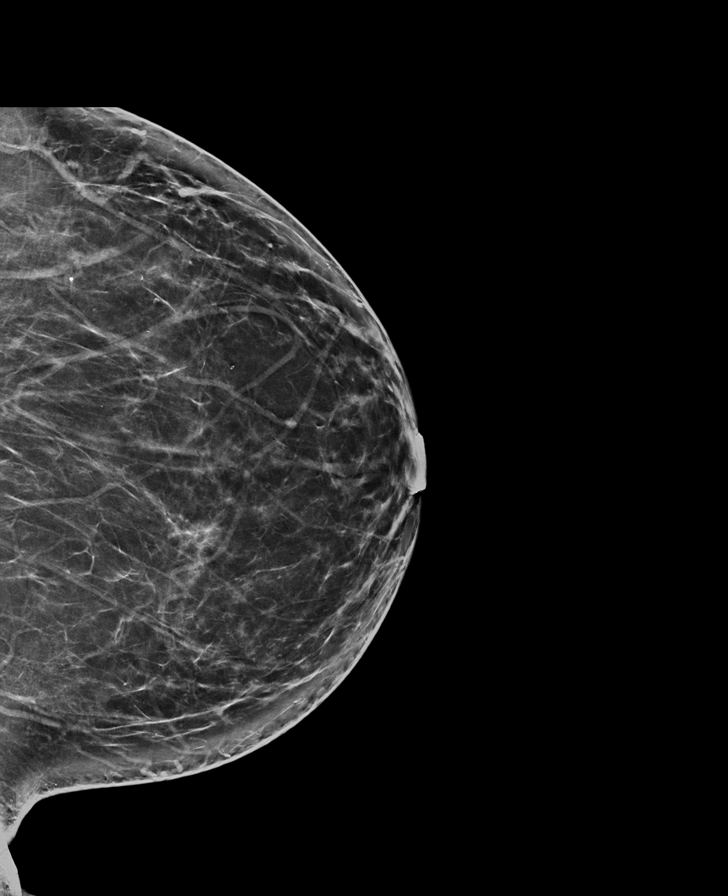

[R MLO synth-2D]
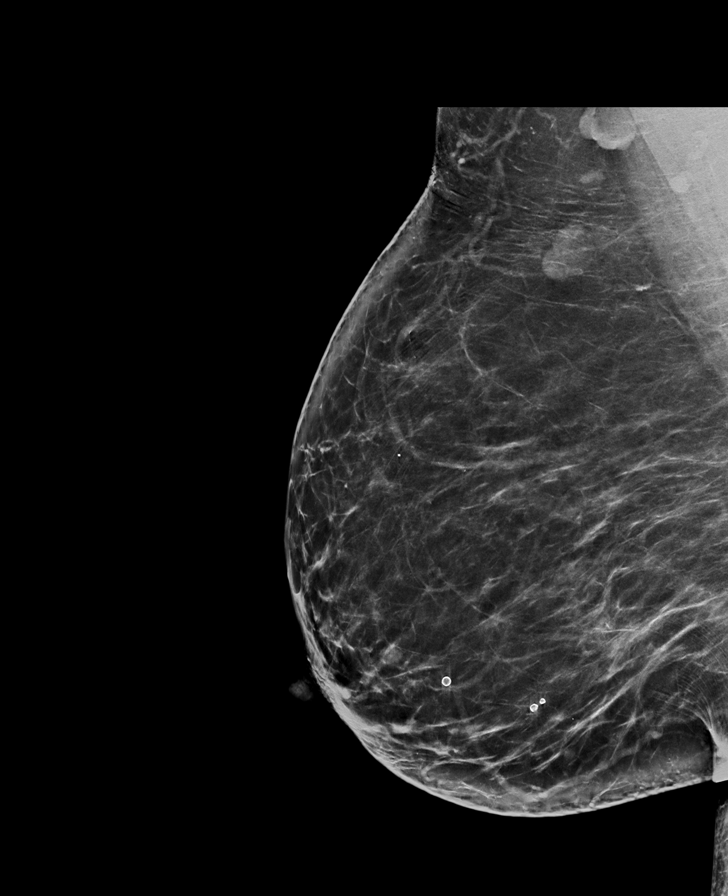

[R CC tomo · tomo slice 35/69.0]
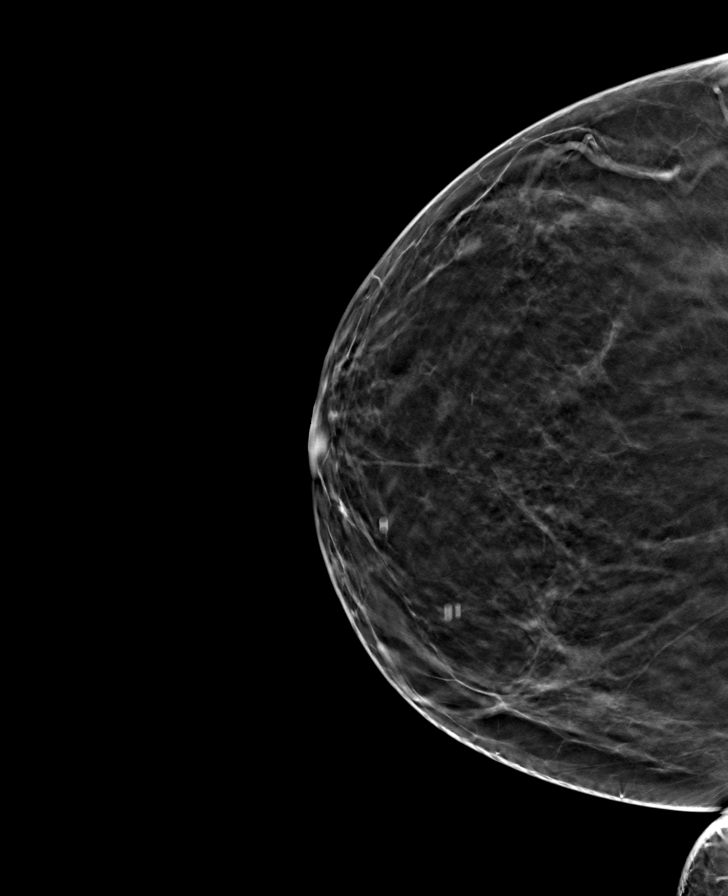

[L CC tomo · tomo slice 41/80.0]
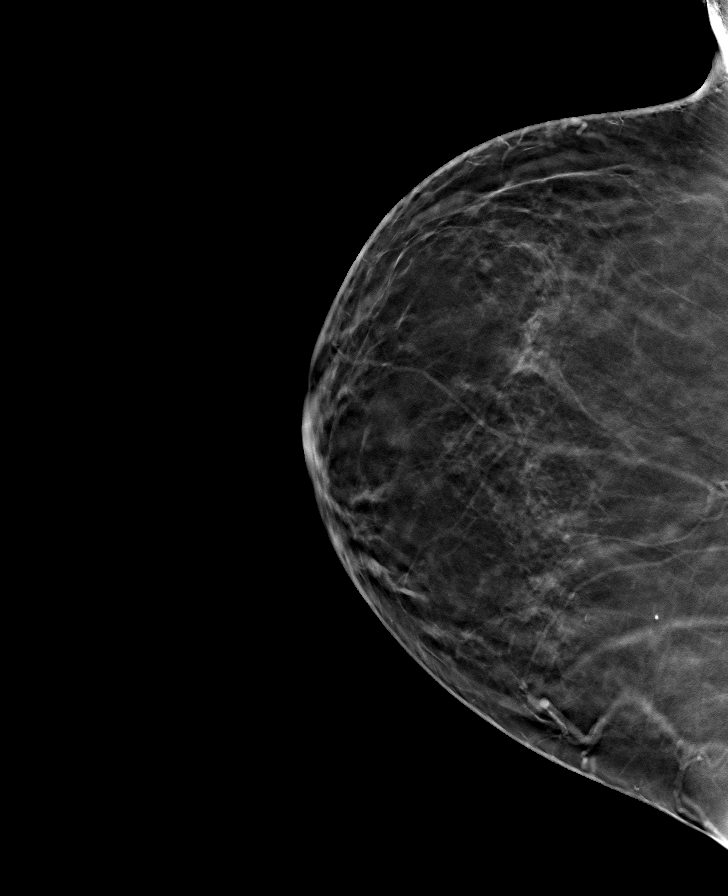

[R MLO tomo · tomo slice 45/90.0]
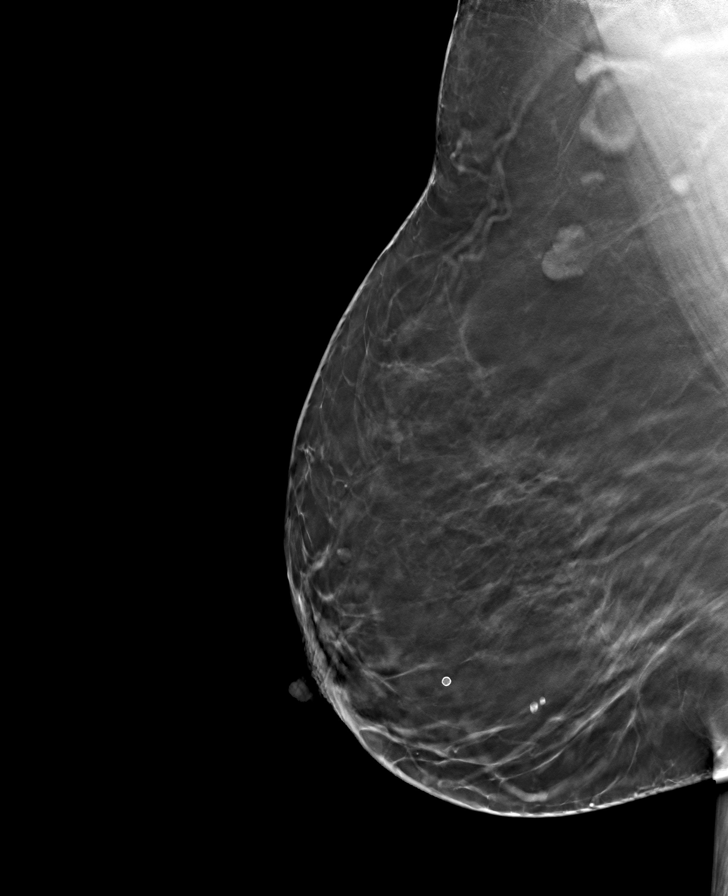

[L MLO tomo · tomo slice 47/92.0]
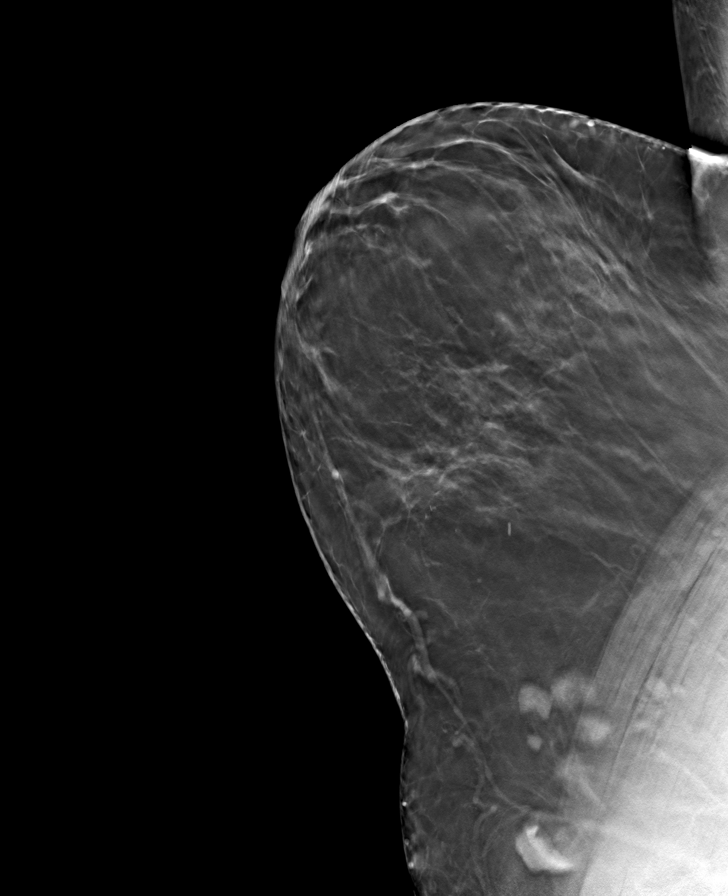

[8 of 24 positions shown; findings below may reference images not displayed]

ACR Breast Density Category b: There are scattered areas of
fibroglandular density.
FINDINGS: There are no findings suspicious for malignancy.
IMPRESSION: No mammographic evidence of malignancy. A result letter of this
screening mammogram will be mailed directly to the patient.

RECOMMENDATION:
Screening mammogram in one year. (Code:51-O-LD2)

BI-RADS CATEGORY  1: Negative.

## 2022-07-02 ENCOUNTER — Ambulatory Visit: Payer: BC Managed Care – PPO

## 2022-08-22 ENCOUNTER — Ambulatory Visit
Admission: RE | Admit: 2022-08-22 | Discharge: 2022-08-22 | Disposition: A | Discharge status: Home / Self Care | Payer: BC Managed Care – PPO | Source: Ambulatory Visit | Attending: Family Medicine | Admitting: Family Medicine

## 2022-08-22 DIAGNOSIS — Z1231 Encounter for screening mammogram for malignant neoplasm of breast: Secondary | ICD-10-CM | POA: Insufficient documentation

## 2022-10-01 ENCOUNTER — Ambulatory Visit: Payer: BC Managed Care – PPO

## 2022-10-11 ENCOUNTER — Other Ambulatory Visit: Payer: Self-pay

## 2022-10-11 ENCOUNTER — Ambulatory Visit (LOCAL_COMMUNITY_HEALTH_CENTER): Payer: BC Managed Care – PPO

## 2022-10-11 DIAGNOSIS — Z111 Encounter for screening for respiratory tuberculosis: Secondary | ICD-10-CM

## 2022-10-14 ENCOUNTER — Ambulatory Visit (LOCAL_COMMUNITY_HEALTH_CENTER): Payer: Self-pay

## 2022-10-14 DIAGNOSIS — Z111 Encounter for screening for respiratory tuberculosis: Secondary | ICD-10-CM

## 2022-10-14 LAB — TB SKIN TEST
Induration: 0 mm
TB Skin Test: NEGATIVE

## 2023-03-11 ENCOUNTER — Encounter: Payer: Self-pay | Admitting: Obstetrics and Gynecology

## 2023-03-11 ENCOUNTER — Ambulatory Visit: Payer: BC Managed Care – PPO | Admitting: Obstetrics and Gynecology

## 2023-03-11 ENCOUNTER — Other Ambulatory Visit (HOSPITAL_COMMUNITY)
Admission: RE | Admit: 2023-03-11 | Discharge: 2023-03-11 | Disposition: A | Discharge status: Home / Self Care | Payer: BC Managed Care – PPO | Source: Ambulatory Visit | Attending: Obstetrics and Gynecology | Admitting: Obstetrics and Gynecology

## 2023-03-11 VITALS — BP 136/81 | HR 91 | Ht 65.0 in | Wt 288.0 lb

## 2023-03-11 DIAGNOSIS — Z1151 Encounter for screening for human papillomavirus (HPV): Secondary | ICD-10-CM

## 2023-03-11 DIAGNOSIS — Z803 Family history of malignant neoplasm of breast: Secondary | ICD-10-CM | POA: Insufficient documentation

## 2023-03-11 DIAGNOSIS — Z30431 Encounter for routine checking of intrauterine contraceptive device: Secondary | ICD-10-CM

## 2023-03-11 DIAGNOSIS — Z30432 Encounter for removal of intrauterine contraceptive device: Secondary | ICD-10-CM

## 2023-03-11 DIAGNOSIS — Z01419 Encounter for gynecological examination (general) (routine) without abnormal findings: Secondary | ICD-10-CM | POA: Diagnosis not present

## 2023-03-11 DIAGNOSIS — Z124 Encounter for screening for malignant neoplasm of cervix: Secondary | ICD-10-CM | POA: Insufficient documentation

## 2023-03-11 DIAGNOSIS — Z1231 Encounter for screening mammogram for malignant neoplasm of breast: Secondary | ICD-10-CM

## 2023-03-11 DIAGNOSIS — R1032 Left lower quadrant pain: Secondary | ICD-10-CM

## 2023-03-11 NOTE — Progress Notes (Signed)
PCP: Cathie Hoops, PA   Chief Complaint  Patient presents with   Gynecologic Exam    Slight left side pelvic pain x 2 weeks     HPI:      Ms. Jennifer Mueller is a 57 y.o. [phone] whose LMP was No LMP recorded. (Menstrual status: IUD)., presents today for her annual examination.  Her menses are absent due to IUD/most likely menopause given age. She does not have intermenstrual bleeding. She does have vasomotor sx. Mirena IUD placed 04/15/14.   Sex activity: single partner, contraception - tubal ligation/essure. She does have vaginal dryness, hasn't tried lubricants yet.    Last Pap: 06/17/18  Results were: no abnormalities /neg HPV DNA.   Last mammogram: 08/22/22 Results were: normal--routine follow-up in 12 months There is a FH of breast cancer in her pat 1/2 sister and mat aunt, genetic testing not indicated. There is no FH of ovarian cancer. The patient does not do self-breast exams.  Colonoscopy: 2018 with Dr. Servando Snare; Repeat due after 10 years.   Tobacco use: The patient denies current or previous tobacco use. Alcohol use: none No drug use Exercise: not active  She does not get adequate calcium and Vitamin D in her diet.  Labs with PCP.  Has noticed LLQ pain for past 2 wks. Sx are intermittent and crampy, improved with ibup. Sx increase with activity. 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 Not exercising.    Patient Active Problem List   Diagnosis Date Noted   Family history of breast cancer 03/11/2023   Severe sepsis with acute organ dysfunction due to Gram negative bacteria (HCC) 07/06/2020   Pyelonephritis 07/05/2020   Essential hypertension 07/05/2020   Hyperlipidemia 07/05/2020   Obesity, Class III, BMI 40-49.9 (morbid obesity) (HCC) 07/05/2020   Special screening for malignant neoplasms, colon     Past Surgical History:  Procedure Laterality Date   CATARACT EXTRACTION W/PHACO Right 01/22/2017   Procedure: CATARACT EXTRACTION PHACO AND INTRAOCULAR LENS PLACEMENT  (IOC)  Right Complicated;  Surgeon: Lockie Mola, MD;  Location: [facility];  Service: Ophthalmology;  Laterality: Right;   healon 5 vision blue   CERVICAL POLYPECTOMY  2015   COLONOSCOPY WITH PROPOFOL N/A 07/14/2017   Procedure: COLONOSCOPY WITH PROPOFOL;  Surgeon: Midge Minium, MD;  Location: [facility];  Service: Endoscopy;  Laterality: N/A;  sleep apnea    Family History  Problem Relation Age of Onset   Breast cancer Sister 8   Breast cancer Maternal Aunt 19    Social History   Socioeconomic History   Marital status: Married    Spouse name: Not on file   Number of children: Not on file   Years of education: Not on file   Highest education level: Not on file  Occupational History   Not on file  Tobacco Use   Smoking status: Never   Smokeless tobacco: Never  Vaping Use   Vaping Use: Never used  Substance and Sexual Activity   Alcohol use: No    Comment: may have 1 glass wine/month   Drug use: No   Sexual activity: Yes    Birth control/protection: I.U.D.    Comment: Mirena  Other Topics Concern   Not on file  Social History Narrative   Not on file   Social Determinants of Health   Financial Resource Strain: Not on file  Food Insecurity: Not on file  Transportation Needs: Not on file  Physical Activity: Not on file  Stress: Not on  file  Social Connections: Not on file  Intimate Partner Violence: Not on file     Current Outpatient Medications:    amLODipine (NORVASC) 2.5 MG tablet, Take 1 tablet by mouth daily., Disp: , Rfl:    busPIRone (BUSPAR) 15 MG tablet, Take 15 mg by mouth 2 (two) times daily., Disp: , Rfl:    clobetasol (TEMOVATE) 0.05 % external solution, Apply topically daily as needed., Disp: , Rfl:    fluticasone (FLONASE) 50 MCG/ACT nasal spray, Place 2 sprays into both nostrils daily., Disp: 16 g, Rfl: 0   gabapentin (NEURONTIN) 300 MG capsule, Take 300 mg by mouth at bedtime., Disp: , Rfl:    ketoconazole (NIZORAL) 2 %  cream, Apply topically 2 (two) times daily as needed., Disp: , Rfl:    levonorgestrel (MIRENA) 20 MCG/24HR IUD, 1 each by Intrauterine route once., Disp: , Rfl:    lisinopril-hydrochlorothiazide (PRINZIDE,ZESTORETIC) 20-12.5 MG tablet, Take 1 tablet by mouth daily., Disp: , Rfl:    naproxen (NAPROSYN) 500 MG tablet, Take 500 mg by mouth 2 (two) times daily., Disp: , Rfl:    olmesartan-hydrochlorothiazide (BENICAR HCT) 40-25 MG tablet, Take 1 tablet by mouth daily., Disp: , Rfl:    pravastatin (PRAVACHOL) 40 MG tablet, Take 40 mg by mouth daily., Disp: , Rfl:    valACYclovir (VALTREX) 1000 MG tablet, Take 1,000 mg by mouth as needed (outbreak). , Disp: , Rfl:      ROS:  Review of Systems  Constitutional:  Positive for fatigue. Negative for fever and unexpected weight change.  Respiratory:  Negative for cough, shortness of breath and wheezing.   Cardiovascular:  Negative for chest pain, palpitations and leg swelling.  Gastrointestinal:  Negative for blood in stool, constipation, diarrhea, nausea and vomiting.  Endocrine: Negative for cold intolerance, heat intolerance and polyuria.  Genitourinary:  Negative for dyspareunia, dysuria, flank pain, frequency, genital sores, hematuria, menstrual problem, pelvic pain, urgency, vaginal bleeding, vaginal discharge and vaginal pain.  Musculoskeletal:  Positive for arthralgias. Negative for back pain, joint swelling and myalgias.  Skin:  Negative for rash.  Neurological:  Negative for dizziness, syncope, light-headedness, numbness and headaches.  Hematological:  Negative for adenopathy.  Psychiatric/Behavioral:  Positive for agitation. Negative for confusion, sleep disturbance and suicidal ideas. The patient is not nervous/anxious.    BREAST: No symptoms    Objective: BP 136/81   Pulse 91   Ht 5\' 5"  (1.651 m)   Wt 288 lb (130.6 kg)   BMI 47.93 kg/m    Physical Exam Constitutional:      Appearance: She is well-developed.  Genitourinary:      Vulva normal.     Right Labia: No rash, tenderness or lesions.    Left Labia: No tenderness, lesions or rash.    No vaginal discharge, erythema or tenderness.      Right Adnexa: not tender and no mass present.    Left Adnexa: not tender and no mass present.    No cervical friability or polyp.     IUD strings visualized.     Uterus is not enlarged or tender.  Breasts:    Right: No mass, nipple discharge, skin change or tenderness.     Left: No mass, nipple discharge, skin change or tenderness.  Neck:     Thyroid: No thyromegaly.  Cardiovascular:     Rate and Rhythm: Normal rate and regular rhythm.     Heart sounds: Normal heart sounds. No murmur heard. Pulmonary:     Effort: Pulmonary effort  is normal.     Breath sounds: Normal breath sounds.  Abdominal:     Palpations: Abdomen is soft.     Tenderness: There is no abdominal tenderness. There is no guarding or rebound.  Musculoskeletal:        General: Normal range of motion.     Cervical back: Normal range of motion.  Lymphadenopathy:     Cervical: No cervical adenopathy.  Neurological:     General: No focal deficit present.     Mental Status: She is alert and oriented to person, place, and time.     Cranial Nerves: No cranial nerve deficit.  Skin:    General: Skin is warm and dry.  Psychiatric:        Mood and Affect: Mood normal.        Behavior: Behavior normal.        Thought Content: Thought content normal.        Judgment: Judgment normal.  Vitals reviewed.    IUD Removal Strings of IUD identified and grasped.  IUD removed without problem with ring forceps.  Pt tolerated this well.  IUD noted to be intact.   Assessment/Plan:  Encounter for annual routine gynecological examination  Cervical cancer screening - Plan: Cytology - PAP  Screening for HPV (human papillomavirus) - Plan: Cytology - PAP  Encounter for screening mammogram for malignant neoplasm of breast; pt does mammos with PCP  Encounter for  routine checking of intrauterine contraceptive device (IUD); IUD strings in cx os, has 8 yr indication; due for removal due to age/menopause.   Encounter for IUD removal  LLQ pain--neg exam. Questions MSK. Pt to f/u if sx persist/worsen for GYN u/s.           GYN counsel breast self exam, mammography screening, menopause, adequate intake of calcium and vitamin D, diet and exercise    F/U  Return in about 1 year (around 03/10/2024).  Jozey Janco B. Glenice Ciccone, PA-C 03/11/2023 5:08 PM

## 2023-03-11 NOTE — Patient Instructions (Signed)
I value your feedback and you entrusting us with your care. If you get a Jennifer Mueller patient survey, I would appreciate you taking the time to let us know about your experience today. Thank you! ? ? ?

## 2023-03-13 LAB — CYTOLOGY - PAP
Adequacy: ABSENT
Comment: NEGATIVE
Diagnosis: NEGATIVE
High risk HPV: NEGATIVE

## 2024-07-12 ENCOUNTER — Other Ambulatory Visit: Payer: Self-pay | Admitting: Family Medicine

## 2024-07-12 DIAGNOSIS — Z1231 Encounter for screening mammogram for malignant neoplasm of breast: Secondary | ICD-10-CM

## 2024-08-24 ENCOUNTER — Ambulatory Visit
Admission: RE | Admit: 2024-08-24 | Discharge: 2024-08-24 | Disposition: A | Discharge status: Home / Self Care | Payer: Self-pay | Source: Ambulatory Visit | Attending: Family Medicine | Admitting: Family Medicine

## 2024-08-24 DIAGNOSIS — Z1231 Encounter for screening mammogram for malignant neoplasm of breast: Secondary | ICD-10-CM | POA: Diagnosis present
